# Patient Record
Sex: Male | Born: 1955 | Race: White | Hispanic: No | Marital: Married | Smoking: Former smoker
Health system: Southern US, Community
[De-identification: ages and names within clinical notes are randomized; demographics above are authoritative.]

## PROBLEM LIST (undated history)

## (undated) DIAGNOSIS — I872 Venous insufficiency (chronic) (peripheral): Secondary | ICD-10-CM

## (undated) DIAGNOSIS — E079 Disorder of thyroid, unspecified: Secondary | ICD-10-CM

## (undated) DIAGNOSIS — M1990 Unspecified osteoarthritis, unspecified site: Secondary | ICD-10-CM

## (undated) DIAGNOSIS — E039 Hypothyroidism, unspecified: Secondary | ICD-10-CM

## (undated) DIAGNOSIS — E119 Type 2 diabetes mellitus without complications: Secondary | ICD-10-CM

## (undated) HISTORY — PX: KNEE SURGERY: SHX244

## (undated) HISTORY — DX: Venous insufficiency (chronic) (peripheral): I87.2

## (undated) HISTORY — DX: Type 2 diabetes mellitus without complications: E11.9

## (undated) HISTORY — DX: Disorder of thyroid, unspecified: E07.9

---

## 1991-11-19 HISTORY — PX: COLONOSCOPY: SHX174

## 1998-11-18 DIAGNOSIS — E079 Disorder of thyroid, unspecified: Secondary | ICD-10-CM

## 1998-11-18 HISTORY — DX: Disorder of thyroid, unspecified: E07.9

## 2008-11-18 DIAGNOSIS — E119 Type 2 diabetes mellitus without complications: Secondary | ICD-10-CM

## 2008-11-18 HISTORY — DX: Type 2 diabetes mellitus without complications: E11.9

## 2010-04-30 ENCOUNTER — Ambulatory Visit: Payer: Self-pay | Admitting: Endocrinology

## 2010-12-26 IMAGING — CT CT STONE STUDY
1 of 2 series · 15 of 32 positions shown, 19 images · non-contrast
Comparison: none

REASON FOR EXAM: STAT CR [id number] hematuria passed kidney stones
COMMENTS:

PROCEDURE:     CT  - CT ABDOMEN /PELVIS WO (STONE)  - April 30, 2010 [DATE]
RESULT:
HISTORY: Hematuria.

[Series 2: stone · axial · 0.98mm/px · z∈[-633,-183]mm · 15 of 164 slices shown, 19 images]
[im 7/164  soft-tissue]
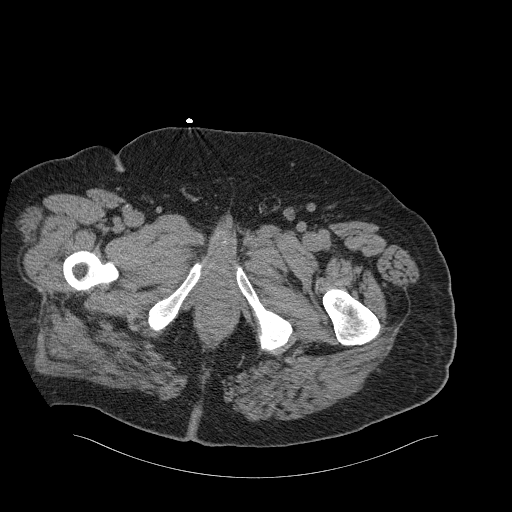
[im 7/164  bone]
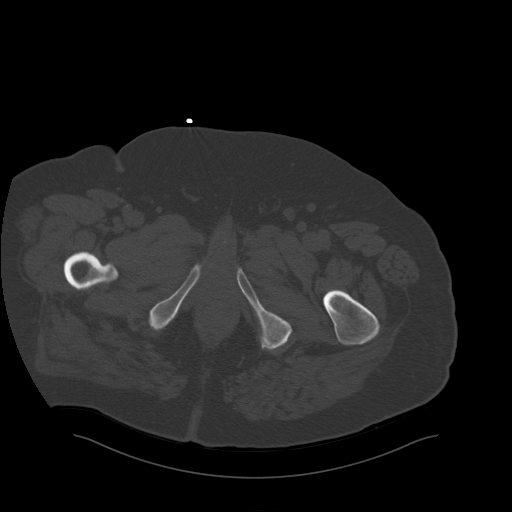
[im 19/164  soft-tissue]
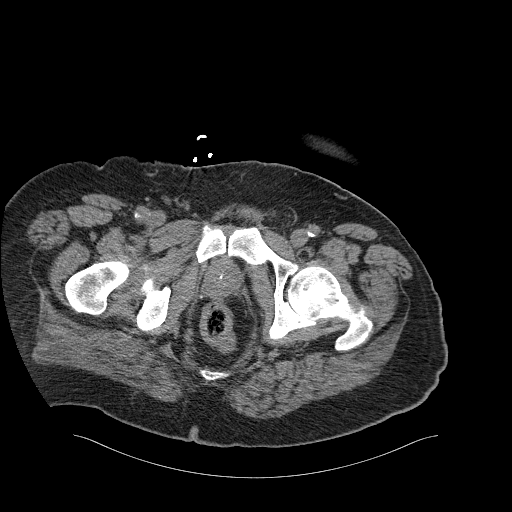
[im 32/164  soft-tissue]
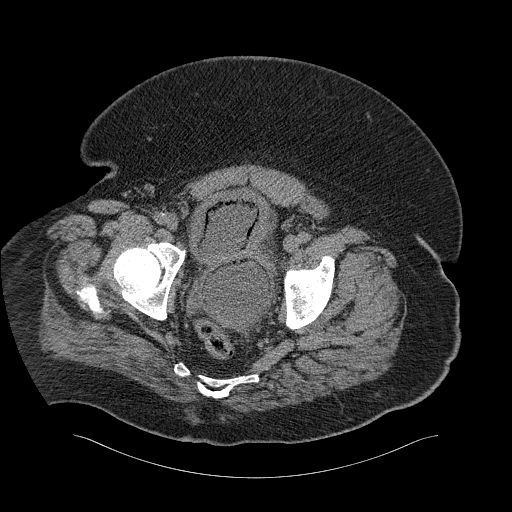
[im 44/164  soft-tissue]
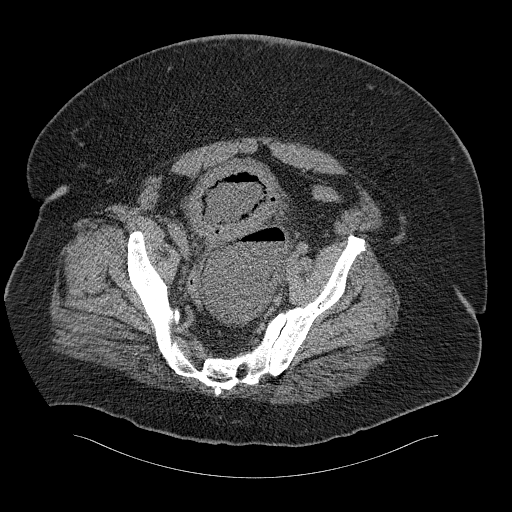
[im 57/164  soft-tissue]
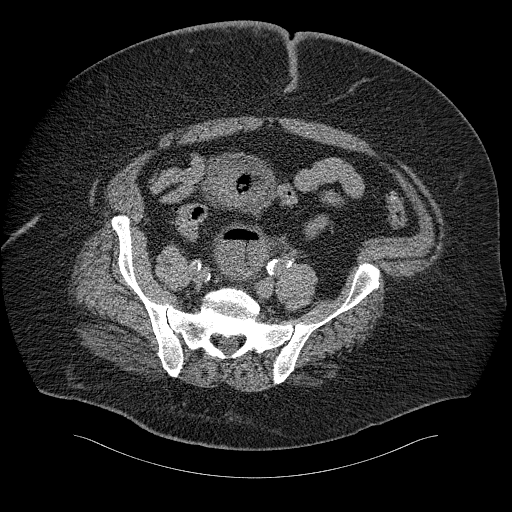
[im 69/164  soft-tissue]
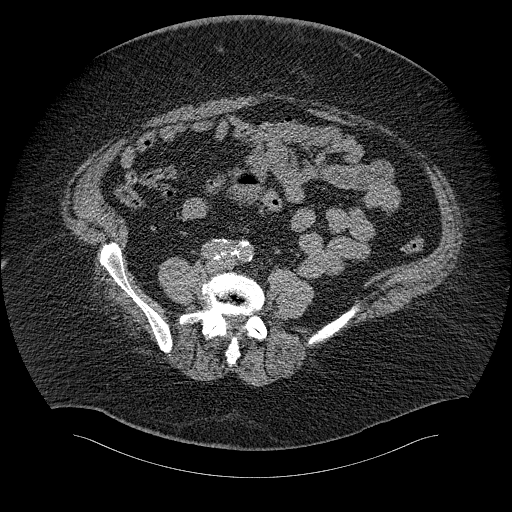
[im 82/164  soft-tissue]
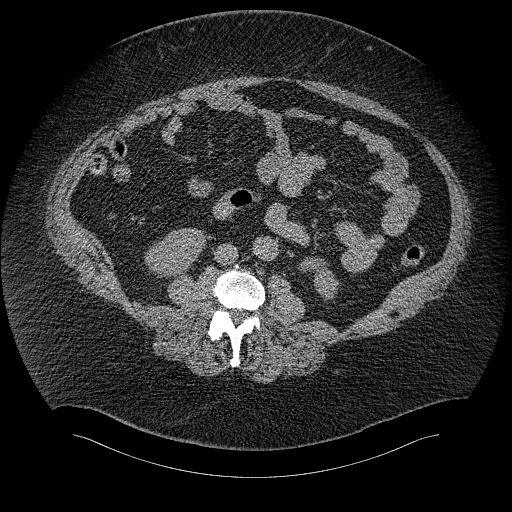
[im 95/164  soft-tissue]
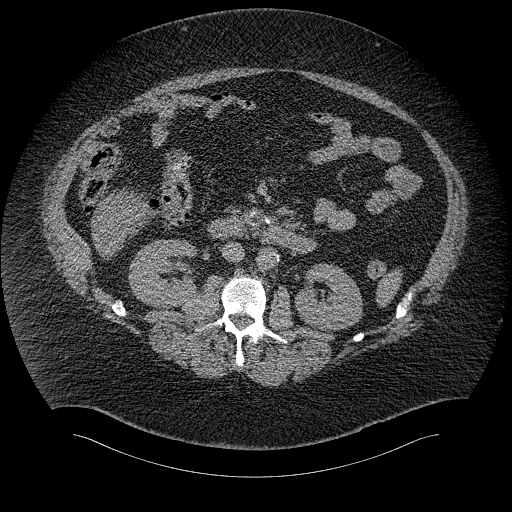
[im 107/164  soft-tissue]
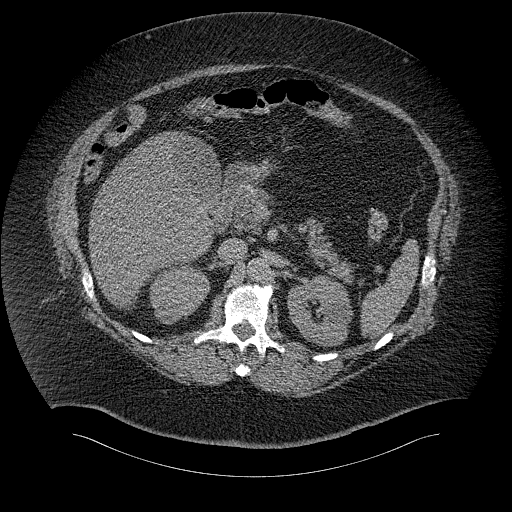
[im 107/164  bone]
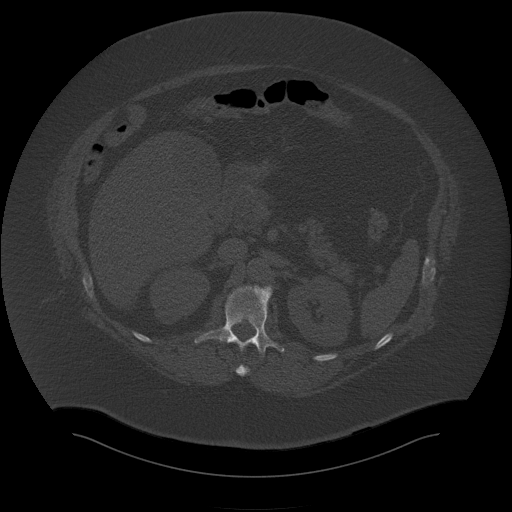
[im 120/164  soft-tissue]
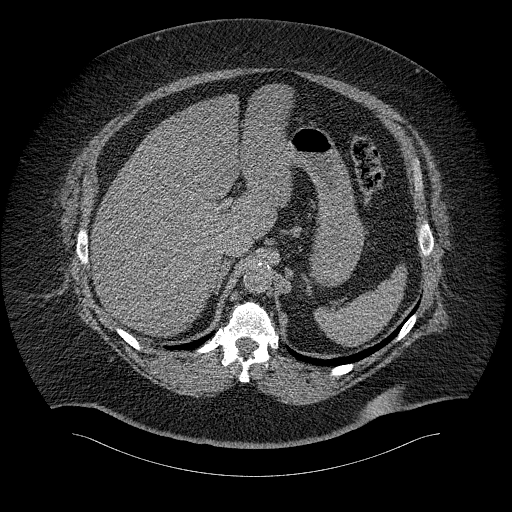
[im 132/164  soft-tissue]
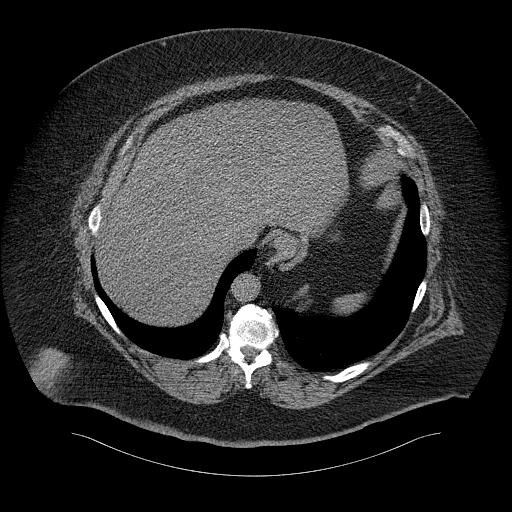
[im 138/164  lung]
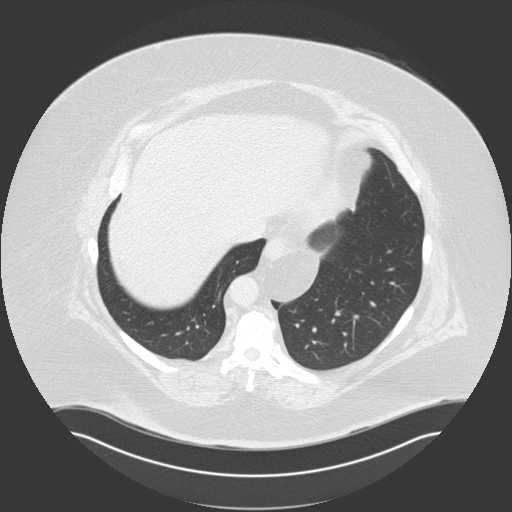
[im 145/164  soft-tissue]
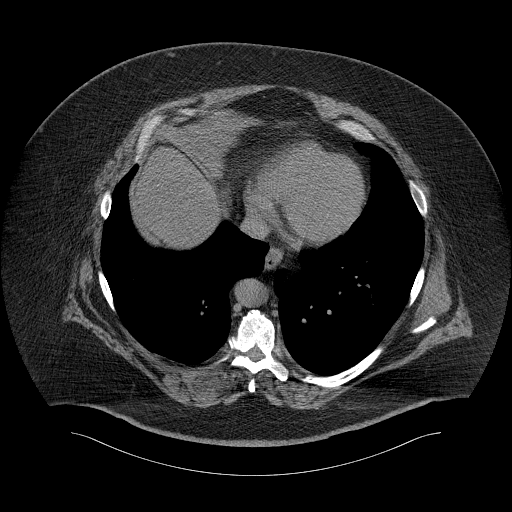
[im 145/164  lung]
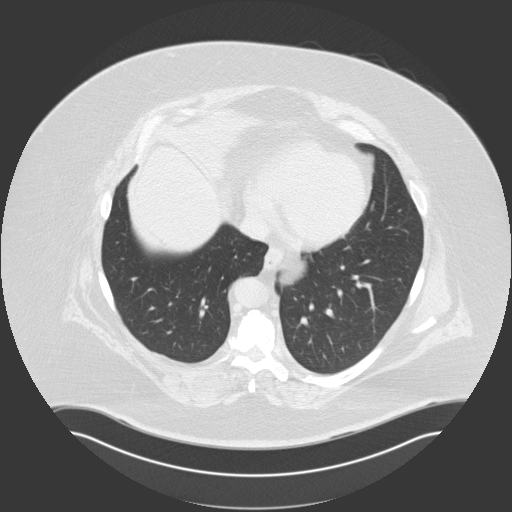
[im 151/164  lung]
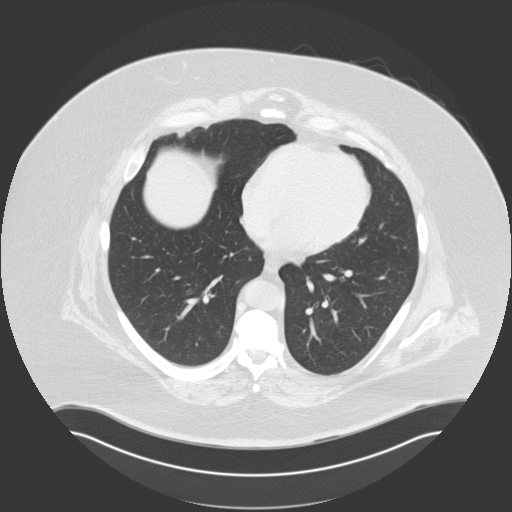
[im 157/164  soft-tissue]
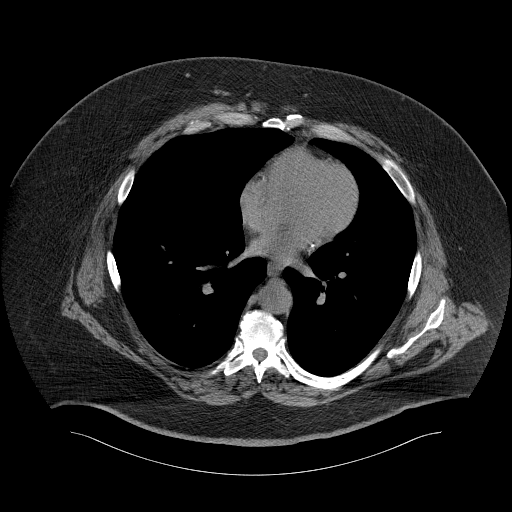
[im 157/164  lung]
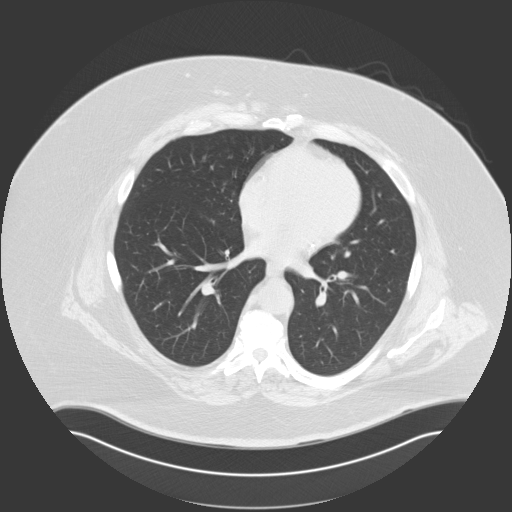

[15 of 32 positions shown; findings below may reference images not displayed]

COMPARISON STUDIES: No recent.

PROCEDURE AND FINDINGS: A simple cyst is present in the left kidney.
Aortoiliac atherosclerotic vascular disease is present. No hydronephrosis or
hydroureter is noted. Noted is thickening of the bladder wall with air in
the lumen of the bladder and the bladder wall. Posterior to the bladder is a
cystic structure which could represent a bladder diverticulum, a bowel
diverticulum or, or an abscess. There is air within the wall of this
structure as well as within the lumen. Urologic consultation is suggested.
Changes in the bladder could be related to severe chronic cystitis. Other
chronic inflammatory changes of the bladder could present in this fashion. A
bowel fistula may be present. Again, an abscess may be present.
IMPRESSION: Grossly abnormal pelvis with grossly abnormal bladder and
adjacent cystic structure, please see complete discussion. Urologic
consultation suggested.

## 2011-05-31 ENCOUNTER — Emergency Department: Payer: Self-pay | Admitting: *Deleted

## 2011-11-19 DIAGNOSIS — I872 Venous insufficiency (chronic) (peripheral): Secondary | ICD-10-CM

## 2011-11-19 HISTORY — DX: Venous insufficiency (chronic) (peripheral): I87.2

## 2012-01-26 IMAGING — CR RIGHT ELBOW - COMPLETE 3+ VIEW
1 series · 4 of 4 positions shown · non-contrast
Comparison: none

REASON FOR EXAM: fall, lac
COMMENTS:   May transport without cardiac monitor

[Series 1: view not recorded · 0.17mm/px · 4 of 4 slices shown]
[im 1/4]
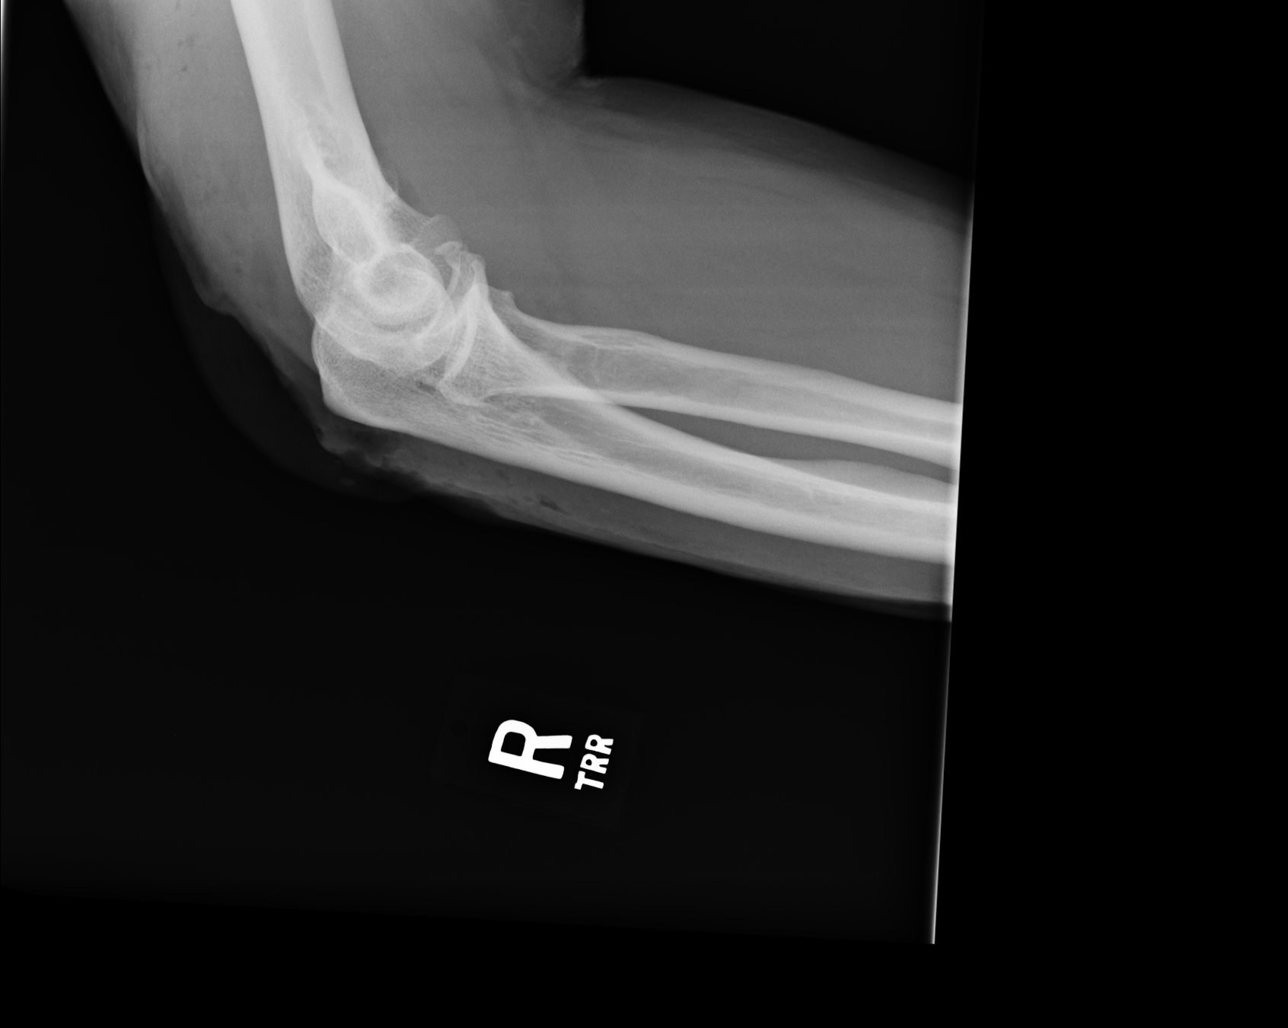
[im 2/4]
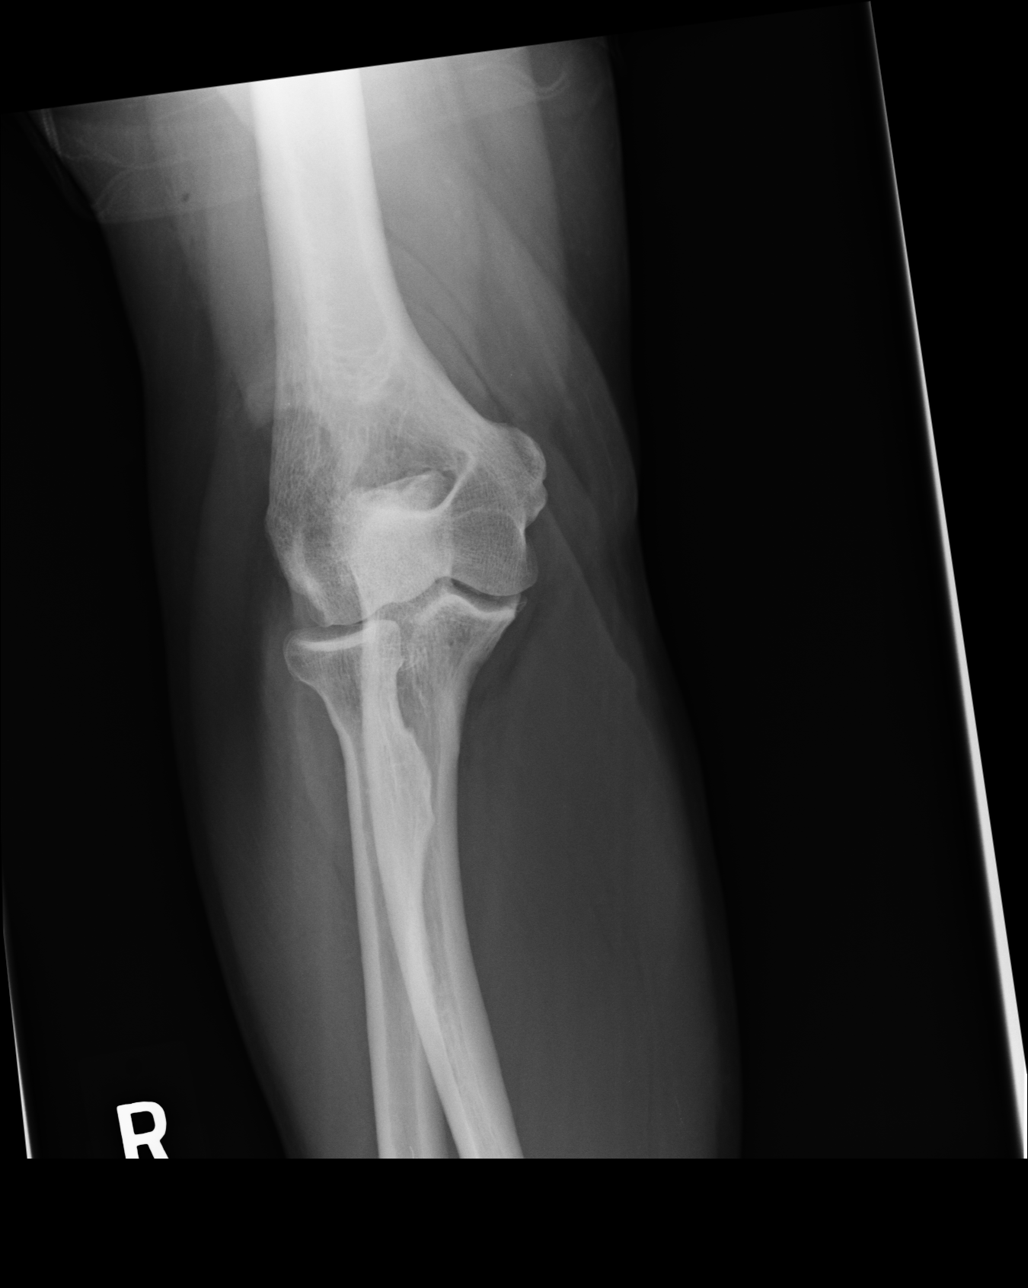
[im 3/4]
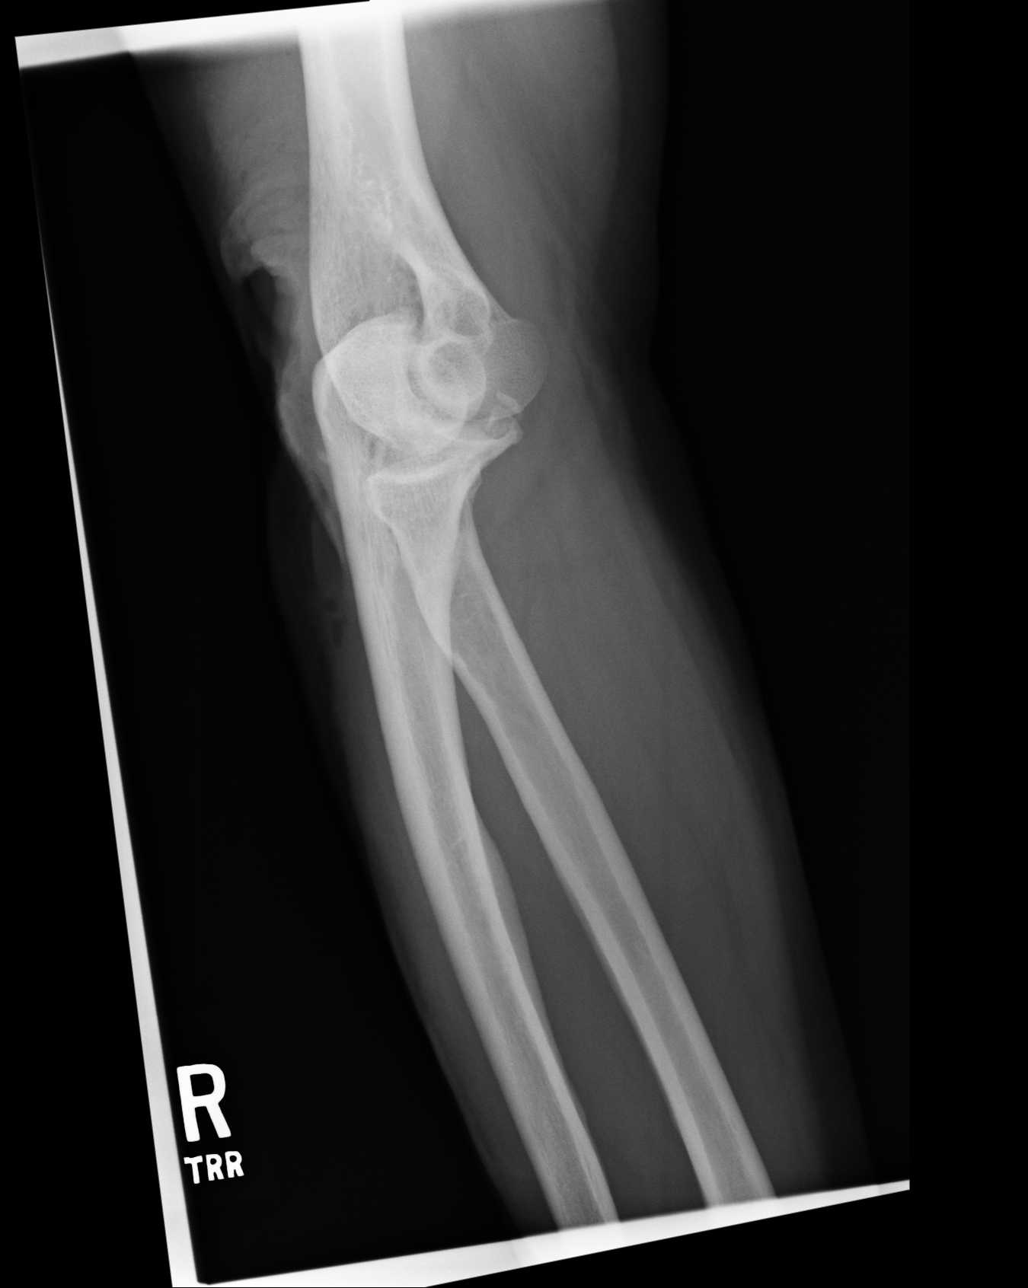
[im 4/4]
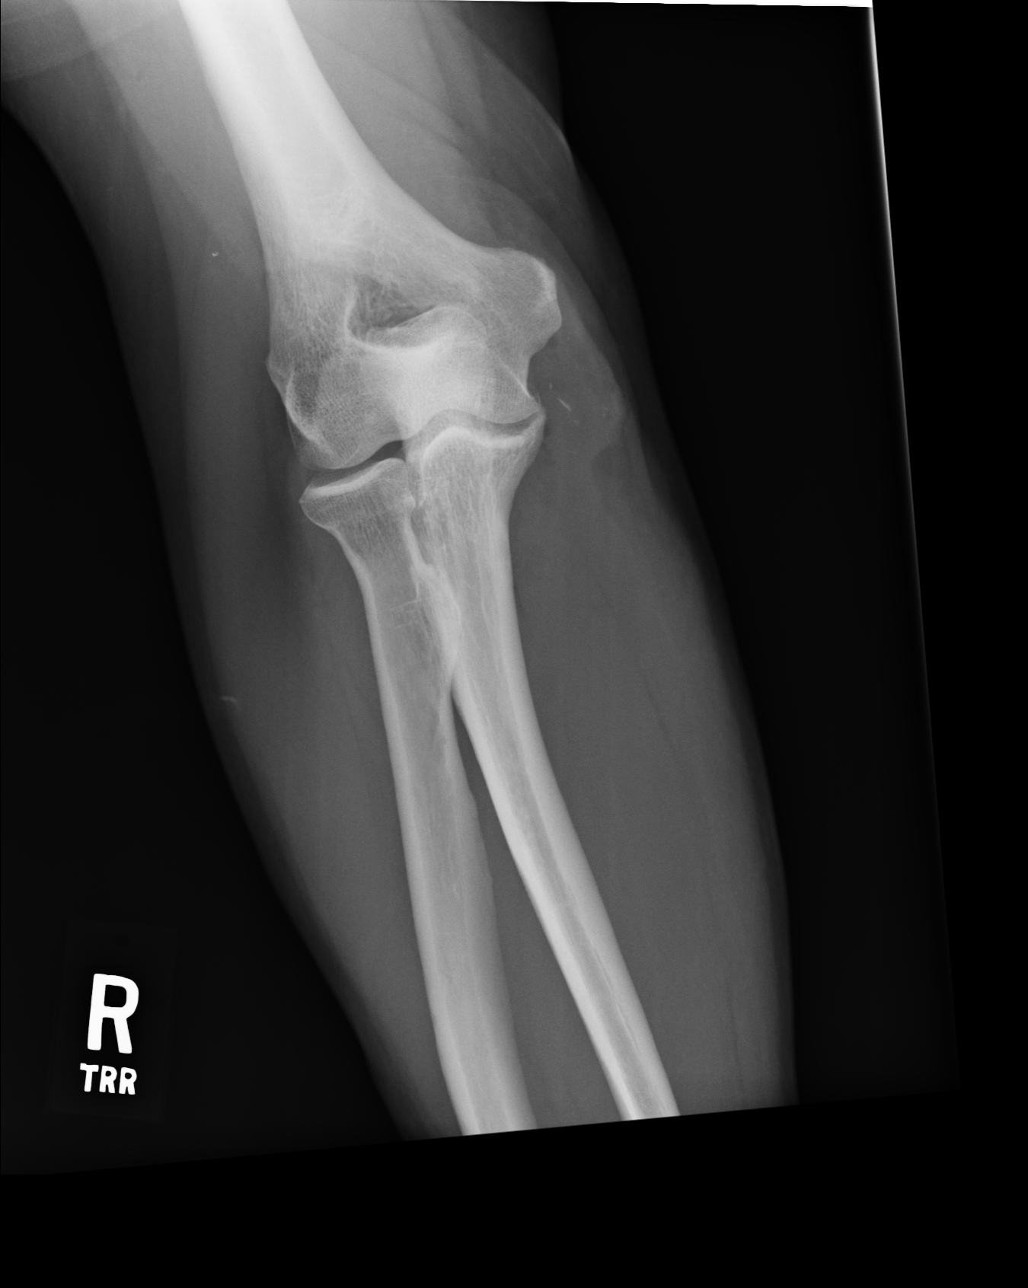

[4 of 4 positions shown; findings below may reference images not displayed]

PROCEDURE:     DXR - DXR ELBOW RT COMP W/OBLIQUES  - May 31, 2011  [DATE]

RESULT:     Four views of the elbow are submitted. The bones are adequately
mineralized for age. I do not see evidence of an acute fracture but there is
radiodense material that is seen adjacent to the coronoid process medially
on 2 views. This may reflect an avulsion. There is disruption of the soft
tissues over the extensor surface of the elbow.
IMPRESSION: 1. I cannot exclude avulsion from the coronoid process. There does appear to
be an osteophyte associated with the coronoid process.
2. There is no evidence of a radial head fracture, an olecranon fracture,
nor a supracondylar fracture.
3. There is disruption of the soft tissues over the extensor surface of the
elbow.

## 2013-01-08 ENCOUNTER — Encounter: Payer: Self-pay | Admitting: *Deleted

## 2013-03-03 ENCOUNTER — Encounter: Payer: Self-pay | Admitting: General Surgery

## 2013-03-03 ENCOUNTER — Ambulatory Visit (INDEPENDENT_AMBULATORY_CARE_PROVIDER_SITE_OTHER): Payer: PRIVATE HEALTH INSURANCE | Admitting: General Surgery

## 2013-03-03 VITALS — BP 140/80 | HR 84 | Resp 18 | Ht 70.0 in | Wt 343.0 lb

## 2013-03-03 DIAGNOSIS — I872 Venous insufficiency (chronic) (peripheral): Secondary | ICD-10-CM

## 2013-03-03 NOTE — Patient Instructions (Addendum)
Continue to wear compression hose Monitor for changes and call for questions or concerns

## 2013-03-03 NOTE — Progress Notes (Signed)
Patient ID: Randy Hart, male   DOB: 1956/03/11, 57 y.o.   MRN: [id number]  Chief Complaint  Patient presents with  . Follow-up    HPI Randy Hart is a 57 y.o. male.  Patient here today for follow up of lower extremity edema.  He has a history of right leg ulcer that was treated with unna boots in Sept 2013.  He is wearing his compression hose and states "getting along good", denies ulcers. HPI  Past Medical History  Diagnosis Date  . Diabetes mellitus without complication 2010  . Thyroid gland disease 2000  . Unspecified venous (peripheral) insufficiency 2013    Past Surgical History  Procedure Laterality Date  . Knee surgery Bilateral 1970,2001  . Colonoscopy  1993    [facility]     History reviewed. No pertinent family history.  Social History History  Substance Use Topics  . Smoking status: Former Smoker -- 5.00 packs/day for 5 years    Types: Cigarettes  . Smokeless tobacco: Not on file  . Alcohol Use: No    No Known Allergies  Current Outpatient Prescriptions  Medication Sig Dispense Refill  . Canagliflozin (INVOKANA) 300 MG TABS Take by mouth.      . finasteride (PROSCAR) 5 MG tablet Take 5 mg by mouth daily.      . furosemide (LASIX) 40 MG tablet Take 40 mg by mouth daily.      . insulin lispro protamine-insulin lispro (HUMALOG 50/50) (50-50) 100 UNIT/ML SUSP Inject into the skin 2 (two) times daily before a meal.      . lisinopril (PRINIVIL,ZESTRIL) 10 MG tablet Take 10 mg by mouth daily.      . Multiple Vitamins-Minerals (MULTIVITAMIN WITH MINERALS) tablet Take 1 tablet by mouth daily.      Marland Kitchen thyroid (ARMOUR) 180 MG tablet Take 180 mg by mouth daily.       No current facility-administered medications for this visit.    Review of Systems Review of Systems  Constitutional: Negative.   Respiratory: Negative.   Cardiovascular: Negative.     Blood pressure 140/80, pulse 84, resp. rate 18, height 5\' 10"  (1.778 m), weight 343 lb  (155.584 kg).  Physical Exam Physical Exam  Constitutional: He is oriented to person, place, and time. He appears well-developed and well-nourished.  Cardiovascular: Intact distal pulses.   Scant edema in upper portion of lower legs bilateral Healed ulceration anterior right lower 1/3 leg  Neurological: He is alert and oriented to person, place, and time.  Skin: Skin is warm and dry.    Data Reviewed None  Assessment    Chronic venous insufficiency with healed right leg ulcer    Plan    Patient advised to continue use of compression stockings       Telena Peyser G 03/07/2013, 11:34 AM

## 2013-03-07 ENCOUNTER — Encounter: Payer: Self-pay | Admitting: General Surgery

## 2013-03-07 DIAGNOSIS — I872 Venous insufficiency (chronic) (peripheral): Secondary | ICD-10-CM | POA: Insufficient documentation

## 2013-06-07 ENCOUNTER — Ambulatory Visit (INDEPENDENT_AMBULATORY_CARE_PROVIDER_SITE_OTHER): Payer: PRIVATE HEALTH INSURANCE | Admitting: General Surgery

## 2013-06-07 VITALS — BP 138/70 | HR 72 | Resp 16 | Ht 70.0 in | Wt 347.0 lb

## 2013-06-07 DIAGNOSIS — I872 Venous insufficiency (chronic) (peripheral): Secondary | ICD-10-CM

## 2013-06-07 NOTE — Patient Instructions (Addendum)
Patient to return in six months  

## 2013-06-07 NOTE — Progress Notes (Signed)
Patient ID: Randy Hart, male   DOB: Oct 24, 1956, 57 y.o.   MRN: [id number]  Chief Complaint  Patient presents with  . Other    leg check Venous insuff    HPI Randy Hart is a 57 y.o. male here today for an leg check . Pt has venous insifficiency. Initially treated with una boot, now using compression stockings. He his very comfortable with use of stockings-no leg symptoms. HPI  Past Medical History  Diagnosis Date  . Diabetes mellitus without complication 2010  . Thyroid gland disease 2000  . Unspecified venous (peripheral) insufficiency 2013    Past Surgical History  Procedure Laterality Date  . Knee surgery Bilateral 1970,2001  . Colonoscopy  1993    [facility]     History reviewed. No pertinent family history.  Social History History  Substance Use Topics  . Smoking status: Former Smoker -- 5.00 packs/day for 5 years    Types: Cigarettes  . Smokeless tobacco: Not on file  . Alcohol Use: No    No Known Allergies  Current Outpatient Prescriptions  Medication Sig Dispense Refill  . Canagliflozin (INVOKANA) 300 MG TABS Take by mouth.      . finasteride (PROSCAR) 5 MG tablet Take 5 mg by mouth daily.      . furosemide (LASIX) 40 MG tablet Take 40 mg by mouth daily.      . insulin lispro protamine-insulin lispro (HUMALOG 50/50) (50-50) 100 UNIT/ML SUSP Inject into the skin 2 (two) times daily before a meal.      . lisinopril (PRINIVIL,ZESTRIL) 10 MG tablet Take 10 mg by mouth daily.      . Multiple Vitamins-Minerals (MULTIVITAMIN WITH MINERALS) tablet Take 1 tablet by mouth daily.      Marland Kitchen thyroid (ARMOUR) 180 MG tablet Take 180 mg by mouth daily.       No current facility-administered medications for this visit.    Review of Systems Review of Systems  Constitutional: Negative.   Respiratory: Negative.   Cardiovascular: Negative.     Blood pressure 138/70, pulse 72, resp. rate 16, height 5\' 10"  (1.778 m), weight 347 lb (157.398  kg).  Physical Exam Physical ExamBoth legs with stasis changes noted-right more than left. Mild edema both legs.  Pedal pulses are strong.  Data Reviewed    Assessment    Venous insufficiency lower extremities. Stable     Plan    Continue with compression stockings        SANKAR,SEEPLAPUTHUR G 06/10/2013, 6:36 AM

## 2013-06-10 ENCOUNTER — Encounter: Payer: Self-pay | Admitting: General Surgery

## 2013-12-16 ENCOUNTER — Ambulatory Visit: Payer: PRIVATE HEALTH INSURANCE | Admitting: General Surgery

## 2014-02-10 ENCOUNTER — Ambulatory Visit: Payer: PRIVATE HEALTH INSURANCE | Admitting: General Surgery

## 2014-02-16 ENCOUNTER — Ambulatory Visit: Payer: PRIVATE HEALTH INSURANCE | Admitting: General Surgery

## 2014-03-15 ENCOUNTER — Ambulatory Visit: Payer: PRIVATE HEALTH INSURANCE | Admitting: General Surgery

## 2016-07-17 ENCOUNTER — Encounter: Payer: Self-pay | Admitting: *Deleted

## 2016-07-17 NOTE — Patient Instructions (Signed)
  Your procedure is scheduled on: 07-23-16 (TUESDAY) Report to Same Day Surgery 2nd floor medical mall To find out your arrival time please call [phone] between 1PM - 3PM on 07-19-16 (FRIDAY)  Remember: Instructions that are not followed completely may result in serious medical risk, up to and including death, or upon the discretion of your surgeon and anesthesiologist your surgery may need to be rescheduled.    _x___ 1. Do not eat food or drink liquids after midnight. No gum chewing or hard candies.     __x__ 2. No Alcohol for 24 hours before or after surgery.   __x__3. No Smoking for 24 prior to surgery.   ____  4. Bring all medications with you on the day of surgery if instructed.    __x__ 5. Notify your doctor if there is any change in your medical condition     (cold, fever, infections).     Do not wear jewelry, make-up, hairpins, clips or nail polish.  Do not wear lotions, powders, or perfumes. You may wear deodorant.  Do not shave 48 hours prior to surgery. Men may shave face and neck.  Do not bring valuables to the hospital.    [facility] is not responsible for any belongings or valuables.               Contacts, dentures or bridgework may not be worn into surgery.  Leave your suitcase in the car. After surgery it may be brought to your room.  For patients admitted to the hospital, discharge time is determined by your treatment team.   Patients discharged the day of surgery will not be allowed to drive home.    Please read over the following fact sheets that you were given:   [facility] and or MRSA Information   _x___ Take these medicines the morning of surgery with A SIP OF WATER:    1. LISINOPRIL  2. ARMOUR THYROID  3.  4.  5.  6.  ____ Fleet Enema (as directed)   ____ Use CHG Soap or sage wipes as directed on instruction sheet   ____ Use inhalers on the day of surgery and bring to hospital day of surgery  ____ Stop metformin 2 days  prior to surgery    _X___ Take 1/2 of usual insulin dose the night before surgery and none on the morning of surgery.   ____ Stop aspirin or coumadin, or plavix  _x__ Stop Anti-inflammatories such as Advil, Aleve, Ibuprofen, Motrin, Naproxen,          Naprosyn, Goodies powders or aspirin products. Ok to take Tylenol.   ____ Stop supplements until after surgery.    ____ Bring C-Pap to the hospital.

## 2016-07-19 ENCOUNTER — Encounter
Admission: RE | Admit: 2016-07-19 | Discharge: 2016-07-19 | Disposition: A | Discharge status: Home / Self Care | Payer: BLUE CROSS/BLUE SHIELD | Source: Ambulatory Visit | Attending: Urology | Admitting: Urology

## 2016-07-19 DIAGNOSIS — Z01812 Encounter for preprocedural laboratory examination: Secondary | ICD-10-CM | POA: Diagnosis not present

## 2016-07-19 LAB — BASIC METABOLIC PANEL
Anion gap: 4 — ABNORMAL LOW (ref 5–15)
BUN: 18 mg/dL (ref 6–20)
CALCIUM: 8.6 mg/dL — AB (ref 8.9–10.3)
CHLORIDE: 103 mmol/L (ref 101–111)
CO2: 28 mmol/L (ref 22–32)
CREATININE: 0.69 mg/dL (ref 0.61–1.24)
GFR calc non Af Amer: >60 mL/min (ref >60)
Glucose, Bld: 220 mg/dL — ABNORMAL HIGH (ref 65–99)
Potassium: 4 mmol/L (ref 3.5–5.1)
Sodium: 135 mmol/L (ref 135–145)

## 2016-07-23 ENCOUNTER — Encounter: Payer: Self-pay | Admitting: *Deleted

## 2016-07-23 ENCOUNTER — Ambulatory Visit
Admission: RE | Admit: 2016-07-23 | Discharge: 2016-07-23 | Disposition: A | Discharge status: Home / Self Care | Payer: BLUE CROSS/BLUE SHIELD | Source: Ambulatory Visit | Attending: Urology | Admitting: Urology

## 2016-07-23 ENCOUNTER — Ambulatory Visit: Payer: BLUE CROSS/BLUE SHIELD | Admitting: Anesthesiology

## 2016-07-23 ENCOUNTER — Encounter
Admission: RE | Disposition: A | Discharge status: Home / Self Care | Payer: Self-pay | Source: Ambulatory Visit | Attending: Urology

## 2016-07-23 DIAGNOSIS — Z6841 Body Mass Index (BMI) 40.0 and over, adult: Secondary | ICD-10-CM | POA: Diagnosis not present

## 2016-07-23 DIAGNOSIS — Z791 Long term (current) use of non-steroidal anti-inflammatories (NSAID): Secondary | ICD-10-CM | POA: Insufficient documentation

## 2016-07-23 DIAGNOSIS — E039 Hypothyroidism, unspecified: Secondary | ICD-10-CM | POA: Diagnosis not present

## 2016-07-23 DIAGNOSIS — Z9889 Other specified postprocedural states: Secondary | ICD-10-CM | POA: Diagnosis not present

## 2016-07-23 DIAGNOSIS — N323 Diverticulum of bladder: Secondary | ICD-10-CM | POA: Insufficient documentation

## 2016-07-23 DIAGNOSIS — R3915 Urgency of urination: Secondary | ICD-10-CM | POA: Diagnosis not present

## 2016-07-23 DIAGNOSIS — E669 Obesity, unspecified: Secondary | ICD-10-CM | POA: Insufficient documentation

## 2016-07-23 DIAGNOSIS — R350 Frequency of micturition: Secondary | ICD-10-CM | POA: Insufficient documentation

## 2016-07-23 DIAGNOSIS — Z794 Long term (current) use of insulin: Secondary | ICD-10-CM | POA: Insufficient documentation

## 2016-07-23 DIAGNOSIS — Z79899 Other long term (current) drug therapy: Secondary | ICD-10-CM | POA: Insufficient documentation

## 2016-07-23 DIAGNOSIS — E119 Type 2 diabetes mellitus without complications: Secondary | ICD-10-CM | POA: Diagnosis not present

## 2016-07-23 DIAGNOSIS — N138 Other obstructive and reflux uropathy: Secondary | ICD-10-CM | POA: Diagnosis not present

## 2016-07-23 DIAGNOSIS — N401 Enlarged prostate with lower urinary tract symptoms: Secondary | ICD-10-CM | POA: Insufficient documentation

## 2016-07-23 DIAGNOSIS — R3914 Feeling of incomplete bladder emptying: Secondary | ICD-10-CM | POA: Diagnosis not present

## 2016-07-23 DIAGNOSIS — R351 Nocturia: Secondary | ICD-10-CM | POA: Insufficient documentation

## 2016-07-23 DIAGNOSIS — N320 Bladder-neck obstruction: Secondary | ICD-10-CM | POA: Diagnosis not present

## 2016-07-23 HISTORY — PX: GREEN LIGHT LASER TURP (TRANSURETHRAL RESECTION OF PROSTATE: SHX6260

## 2016-07-23 HISTORY — DX: Unspecified osteoarthritis, unspecified site: M19.90

## 2016-07-23 HISTORY — DX: Hypothyroidism, unspecified: E03.9

## 2016-07-23 LAB — GLUCOSE, CAPILLARY
GLUCOSE-CAPILLARY: 154 mg/dL — AB (ref 65–99)
Glucose-Capillary: 207 mg/dL — ABNORMAL HIGH (ref 65–99)

## 2016-07-23 SURGERY — GREEN LIGHT LASER TURP (TRANSURETHRAL RESECTION OF PROSTATE
Anesthesia: General | Site: Prostate | Wound class: Clean Contaminated

## 2016-07-23 MED ORDER — URIBEL 118 MG PO CAPS: 1.0000 | ORAL_CAPSULE | Freq: Four times a day (QID) | ORAL | 3 refills | Status: DC | PRN

## 2016-07-23 MED ORDER — FENTANYL CITRATE (PF) 100 MCG/2ML IJ SOLN
INTRAMUSCULAR | Status: DC | PRN
  Administered 2016-07-23: 300 ug via INTRAVENOUS

## 2016-07-23 MED ORDER — GLYCOPYRROLATE 0.2 MG/ML IJ SOLN
INTRAMUSCULAR | Status: DC | PRN
  Administered 2016-07-23: 0.2 mg via INTRAVENOUS

## 2016-07-23 MED ORDER — PROPOFOL 10 MG/ML IV BOLUS
INTRAVENOUS | Status: DC | PRN
  Administered 2016-07-23: 200 mg via INTRAVENOUS

## 2016-07-23 MED ORDER — HYDROMORPHONE HCL 1 MG/ML IJ SOLN
INTRAMUSCULAR | Status: DC | PRN
  Administered 2016-07-23 (×2): 1 mg via INTRAVENOUS

## 2016-07-23 MED ORDER — EPHEDRINE SULFATE 50 MG/ML IJ SOLN
INTRAMUSCULAR | Status: DC | PRN
  Administered 2016-07-23: 15 mg via INTRAVENOUS

## 2016-07-23 MED ORDER — PHENYLEPHRINE HCL 10 MG/ML IJ SOLN
INTRAMUSCULAR | Status: DC | PRN
  Administered 2016-07-23: 200 ug via INTRAVENOUS
  Administered 2016-07-23: 100 ug via INTRAVENOUS
  Administered 2016-07-23: 200 ug via INTRAVENOUS

## 2016-07-23 MED ORDER — LEVOFLOXACIN 500 MG PO TABS: 500.0000 mg | ORAL_TABLET | Freq: Every day | ORAL | 0 refills | Status: AC

## 2016-07-23 MED ORDER — LIDOCAINE HCL (CARDIAC) 20 MG/ML IV SOLN
INTRAVENOUS | Status: DC | PRN
  Administered 2016-07-23 (×2): 100 mg via INTRAVENOUS

## 2016-07-23 MED ORDER — FENTANYL CITRATE (PF) 100 MCG/2ML IJ SOLN: 25.0000 ug | INTRAMUSCULAR | Status: DC | PRN

## 2016-07-23 MED ORDER — DOCUSATE SODIUM 100 MG PO CAPS: 200.0000 mg | ORAL_CAPSULE | Freq: Two times a day (BID) | ORAL | 3 refills | Status: AC

## 2016-07-23 MED ORDER — ONDANSETRON HCL 4 MG/2ML IJ SOLN: 4.0000 mg | Freq: Once | INTRAMUSCULAR | Status: DC | PRN

## 2016-07-23 MED ORDER — LIDOCAINE HCL 2 % EX GEL
CUTANEOUS | Status: AC
  Filled 2016-07-23: qty 10

## 2016-07-23 MED ORDER — ONDANSETRON HCL 4 MG/2ML IJ SOLN
INTRAMUSCULAR | Status: DC | PRN
  Administered 2016-07-23: 4 mg via INTRAVENOUS

## 2016-07-23 MED ORDER — SODIUM CHLORIDE 0.9 % IV SOLN
INTRAVENOUS | Status: DC
  Administered 2016-07-23 (×2): via INTRAVENOUS

## 2016-07-23 MED ORDER — FAMOTIDINE 20 MG PO TABS
ORAL_TABLET | ORAL | Status: AC
  Filled 2016-07-23: qty 1

## 2016-07-23 MED ORDER — ACETAMINOPHEN-CODEINE #3 300-30 MG PO TABS: 1.0000 | ORAL_TABLET | ORAL | 2 refills | Status: DC | PRN

## 2016-07-23 MED ORDER — FAMOTIDINE 20 MG PO TABS
20.0000 mg | ORAL_TABLET | Freq: Once | ORAL | Status: AC
  Administered 2016-07-23: 20 mg via ORAL

## 2016-07-23 MED ORDER — LIDOCAINE HCL 2 % EX GEL
CUTANEOUS | Status: DC | PRN
  Administered 2016-07-23: 1

## 2016-07-23 MED ORDER — KETAMINE HCL 50 MG/ML IJ SOLN
INTRAMUSCULAR | Status: DC | PRN
  Administered 2016-07-23: 50 mg via INTRAVENOUS

## 2016-07-23 MED ORDER — BELLADONNA ALKALOIDS-OPIUM 16.2-60 MG RE SUPP
RECTAL | Status: AC
  Filled 2016-07-23: qty 1

## 2016-07-23 MED ORDER — BELLADONNA ALKALOIDS-OPIUM 16.2-60 MG RE SUPP
RECTAL | Status: DC | PRN
  Administered 2016-07-23: 1 via RECTAL

## 2016-07-23 MED ORDER — LEVOFLOXACIN IN D5W 500 MG/100ML IV SOLN
500.0000 mg | Freq: Once | INTRAVENOUS | Status: AC
  Administered 2016-07-23: 500 mg via INTRAVENOUS

## 2016-07-23 MED ORDER — LEVOFLOXACIN IN D5W 500 MG/100ML IV SOLN
INTRAVENOUS | Status: AC
  Filled 2016-07-23: qty 100

## 2016-07-23 MED ORDER — ROCURONIUM BROMIDE 100 MG/10ML IV SOLN
INTRAVENOUS | Status: DC | PRN
  Administered 2016-07-23: 50 mg via INTRAVENOUS
  Administered 2016-07-23: 20 mg via INTRAVENOUS

## 2016-07-23 MED ORDER — MIDAZOLAM HCL 2 MG/2ML IJ SOLN
INTRAMUSCULAR | Status: DC | PRN
  Administered 2016-07-23: 2 mg via INTRAVENOUS

## 2016-07-23 MED ORDER — SUGAMMADEX SODIUM 200 MG/2ML IV SOLN
INTRAVENOUS | Status: DC | PRN
  Administered 2016-07-23: 200 mg via INTRAVENOUS

## 2016-07-23 MED ORDER — PHENYLEPHRINE HCL 10 MG/ML IJ SOLN
INTRAVENOUS | Status: DC | PRN
  Administered 2016-07-23: 30 ug/min via INTRAVENOUS

## 2016-07-23 MED ORDER — SUCCINYLCHOLINE CHLORIDE 20 MG/ML IJ SOLN
INTRAMUSCULAR | Status: DC | PRN
  Administered 2016-07-23: 120 mg via INTRAVENOUS

## 2016-07-23 SURGICAL SUPPLY — 30 items
ADAPTER IRRIG TUBE 2 SPIKE SOL (ADAPTER) ×6 IMPLANT
BAG URO DRAIN 2000ML W/SPOUT (MISCELLANEOUS) ×3 IMPLANT
CATH FOLEY 2WAY  5CC 20FR SIL (CATHETERS) ×2
CATH FOLEY 2WAY 5CC 20FR SIL (CATHETERS) ×1 IMPLANT
GLOVE BIO SURGEON STRL SZ7 (GLOVE) ×6 IMPLANT
GLOVE BIO SURGEON STRL SZ7.5 (GLOVE) ×6 IMPLANT
GOWN STRL REUS W/ TWL LRG LVL3 (GOWN DISPOSABLE) ×1 IMPLANT
GOWN STRL REUS W/ TWL XL LVL3 (GOWN DISPOSABLE) ×1 IMPLANT
GOWN STRL REUS W/TWL LRG LVL3 (GOWN DISPOSABLE) ×2
GOWN STRL REUS W/TWL XL LVL3 (GOWN DISPOSABLE) ×2
HOLDER FOLEY CATH W/STRAP (MISCELLANEOUS) ×3 IMPLANT
IV NS 1000ML (IV SOLUTION) ×2
IV NS 1000ML BAXH (IV SOLUTION) ×1 IMPLANT
IV SET PRIMARY 15D 139IN B9900 (IV SETS) ×3 IMPLANT
KIT RM TURNOVER CYSTO AR (KITS) ×3 IMPLANT
LASER GREENLIGHT XPS PROCEDURE (MISCELLANEOUS) ×3 IMPLANT
LASER GRNLGT 950 (MISCELLANEOUS) IMPLANT
LASER GRNLGT MOXY FIBER 750UM (MISCELLANEOUS) ×3 IMPLANT
PACK CYSTO AR (MISCELLANEOUS) ×3 IMPLANT
PREP PVP WINGED SPONGE (MISCELLANEOUS) IMPLANT
SET IRRIG Y TYPE TUR BLADDER L (SET/KITS/TRAYS/PACK) ×3 IMPLANT
SOL .9 NS 3000ML IRR  AL (IV SOLUTION) ×8
SOL .9 NS 3000ML IRR UROMATIC (IV SOLUTION) ×4 IMPLANT
SOL PREP PVP 2OZ (MISCELLANEOUS) ×3
SOLUTION PREP PVP 2OZ (MISCELLANEOUS) ×1 IMPLANT
SURGILUBE 2OZ TUBE FLIPTOP (MISCELLANEOUS) ×3 IMPLANT
SYRINGE IRR TOOMEY STRL 70CC (SYRINGE) ×3 IMPLANT
TUBING CONNECTING 10 (TUBING) IMPLANT
TUBING CONNECTING 10' (TUBING)
WATER STERILE IRR 1000ML POUR (IV SOLUTION) ×3 IMPLANT

## 2016-07-23 NOTE — H&P (Signed)
Date of Initial H&P: 07/16/16  History reviewed, patient examined, no change in status, stable for surgery. 

## 2016-07-23 NOTE — Anesthesia Preprocedure Evaluation (Signed)
Anesthesia Evaluation  Patient identified by MRN, date of birth, ID band Patient awake    Reviewed: Allergy & Precautions, H&P , NPO status , Patient's Chart, lab work & pertinent test results, reviewed documented beta blocker date and time   Airway Mallampati: II  TM Distance: >3 FB Neck ROM: full    Dental  (+) Teeth Intact   Pulmonary neg pulmonary ROS, former smoker,    Pulmonary exam normal        Cardiovascular Exercise Tolerance: Good + Peripheral Vascular Disease  negative cardio ROS Normal cardiovascular exam Rhythm:regular Rate:Normal     Neuro/Psych negative neurological ROS  negative psych ROS   GI/Hepatic negative GI ROS, Neg liver ROS,   Endo/Other  negative endocrine ROSdiabetesHypothyroidism Morbid obesity  Renal/GU negative Renal ROS  negative genitourinary   Musculoskeletal   Abdominal   Peds  Hematology negative hematology ROS (+)   Anesthesia Other Findings   Reproductive/Obstetrics negative OB ROS                             Anesthesia Physical Anesthesia Plan  ASA: III  Anesthesia Plan: General LMA   Post-op Pain Management:    Induction:   Airway Management Planned:   Additional Equipment:   Intra-op Plan:   Post-operative Plan:   Informed Consent: I have reviewed the patients History and Physical, chart, labs and discussed the procedure including the risks, benefits and alternatives for the proposed anesthesia with the patient or authorized representative who has indicated his/her understanding and acceptance.     Plan Discussed with: CRNA  Anesthesia Plan Comments:         Anesthesia Quick Evaluation

## 2016-07-23 NOTE — Anesthesia Procedure Notes (Signed)
Procedure Name: Intubation Date/Time: 07/23/2016 1:28 PM Performed by: Shirlee LimerickMARION, Lorrene Graef Pre-anesthesia Checklist: Patient identified, Emergency Drugs available, Suction available and Patient being monitored Patient Re-evaluated:Patient Re-evaluated prior to inductionOxygen Delivery Method: Circle system utilized Preoxygenation: Pre-oxygenation with 100% oxygen Intubation Type: IV induction Laryngoscope Size: Mac and 4 Grade View: Grade I Tube size: 7.0 mm Number of attempts: 1 Placement Confirmation: ETT inserted through vocal cords under direct vision,  positive ETCO2 and breath sounds checked- equal and bilateral Secured at: 23 cm Tube secured with: Tape Dental Injury: Teeth and Oropharynx as per pre-operative assessment

## 2016-07-23 NOTE — Discharge Instructions (Addendum)
Benign Prostatic Hyperplasia An enlarged prostate (benign prostatic hyperplasia) is common in older men. You may experience the following:  Weak urine stream.  Dribbling.  Feeling like the bladder has not emptied completely.  Difficulty starting urination.  Getting up frequently at night to urinate.  Urinating more frequently during the day. HOME CARE INSTRUCTIONS  Monitor your prostatic hyperplasia for any changes. The following actions may help to alleviate any discomfort you are experiencing:  Give yourself time when you urinate.  Stay away from alcohol.  Avoid beverages containing caffeine, such as coffee, tea, and colas, because they can make the problem worse.  Avoid decongestants, antihistamines, and some prescription medicines that can make the problem worse.  Follow up with your health care provider for further treatment as recommended. SEEK MEDICAL CARE IF:  You are experiencing progressive difficulty voiding.  Your urine stream is progressively getting narrower.  You are awaking from sleep with the urge to void more frequently.  You are constantly feeling the need to void.  You experience loss of urine, especially in small amounts. SEEK IMMEDIATE MEDICAL CARE IF:   You develop increased pain with urination or are unable to urinate.  You develop severe abdominal pain, vomiting, a high fever, or fainting.  You develop back pain or blood in your urine. MAKE SURE YOU:   Understand these instructions.  Will watch your condition.  Will get help right away if you are not doing well or get worse.   This information is not intended to replace advice given to you by your health care provider. Make sure you discuss any questions you have with your health care provider.   Document Released: 11/04/2005 Document Revised: 11/25/2014 Document Reviewed: 04/06/2013 Elsevier Interactive Patient Education 2016 [place] Light Laser Prostate Treatment Green  light laser therapy is a procedure that uses a special high-energy laser for vaporizing extra prostate tissue. It is less invasive than traditional methods of prostate surgery, which involve cutting out the prostate tissue. Because the tissue is vaporized rather than cut out there is generally less blood loss. LET [facility] CARE PROVIDER KNOW ABOUT:  Any allergies you have.  Any medicines you are taking, including vitamins, herbs, eye drops, creams, and over-the-counter medication.  Previous problems you or members of your family have had with the use of anesthetics.  Any blood disorders you have.  Previous surgeries you have had.  Medical conditions you have. RISKS AND COMPLICATIONS Generally, green light laser prostate treatment is a safe procedure. However, as with any procedure, complications can occur. Possible complications include:  Urinary tract infection.  Erectile dysfunction (rare).  Dry ejaculation--Semen is not released when you reach sexual climax.  Scar tissue in the urinary passage. BEFORE THE PROCEDURE   Your health care provider may discuss medicines you are taking and may advise you to stop taking specific ones.  You may be given antibiotic medicine to take as a precaution against bacterial infection.  Do not eat or drink anything for 8 hours before your procedure or as directed by your health care provider. You may have a sip of water to take any necessary medicines. PROCEDURE Depending on the size and shape of your prostate, the procedure may take 30-60 minutes. You will be given one of the following:   A medicine that makes you go to sleep (general anesthetic).  A medicine injected into your spine that numbs your body below the waist (spinal anesthetic). Sedation is usually given with spinal anesthetic so  you will be relaxed. A tube containing viewing scopes and instruments will be inserted through your penis so that no cuts (incisions) are needed. A thin  fiber is put through the tube and positioned next to the excess prostate tissue. Pulses of laser light come from the end of the fiber and are projected onto the excess tissue. The laser beam is absorbed by your blood, which becomes hot enough to vaporize the excess prostate tissue. This laser beam will seal off the blood vessels, decreasing bleeding. The tube with the viewing scopes, instruments, and thin fiber will be removed and replaced with a temporary catheter. AFTER THE PROCEDURE  After the surgery, you will be sent to the recovery room for a short time. Depending on factors such as the amount of prostate tissue vaporized, the strength of your bladder, and the amount of bleeding expected, the catheter may be removed. Generally, overnight stay is not needed and you will be sent home on the same day as the procedure. You may be sent home with elastic support stockings to help prevent blood clots in your legs.    This information is not intended to replace advice given to you by your health care provider. Make sure you discuss any questions you have with your health care provider.   Document Released: 02/11/2008 Document Revised: 11/09/2013 Document Reviewed: 04/26/2013 Elsevier Interactive Patient Education 2016 [facility] Prostate laser surgery is a procedure to eliminate prostate tissue. There are two types of prostate laser surgery: ablation (prostate tissue is melted away) and enucleation (prostate tissue is cut out). LET [facility] CARE PROVIDER KNOW ABOUT:  Any allergies you have.  All medicines you are taking, including vitamins, herbs, eye drops, creams, and over-the-counter medicines.  Previous problems you or members of your family have had with the use of anesthetics.  Any blood disorders you have.  Previous surgeries you have had.  Medical conditions you have. RISKS AND COMPLICATIONS  Generally prostate laser surgery is a safe procedure.  However, as with any procedure, problems can occur. Possible problems include:  Bleeding and the need for a blood transfusion.   Urinary tract infection.  Erectile dysfunction.  Narrowing (scar or stricture) of the urethra, which blocks the flow of urine.  Dry ejaculation (semen is not released when you reach sexual climax). BEFORE THE PROCEDURE   If you are on blood thinners, such as warfarin or clopidogrel, or nonprescription pain-relieving medicines, such as naproxen sodium or ibuprofen, you may be asked to stop taking them before the procedure.  Your health care provider may ask you to start taking antibiotic medicines before the procedure as a precaution against a bacterial infection. The procedure will not be performed if your urine is infected.  You should have nothing to eat or drink for at least 8 hours before your procedure, or as suggested by your health care provider. You may have a sip of water to take medications not stopped for the procedure. PROCEDURE  You will be given one of the following:   A medicine that numbs the area (local anesthetic).  A medicine injected into your spine that numbs your body below the waist (spinal anesthetic). A sedative is usually given with spinal anesthetic so you will be relaxed during the procedure. A viewing scope and instruments will be placed in a tube that is inserted through your penis, so no incisions will be needed to insert the scope and instruments. Depending on the type of laser  used, the prostate tissue will either be vaporized or cut away. The laser beam will coagulate any small bleeding areas. At the end of the surgery, a special tube will be inserted into your bladder to drain the urine from your bladder (urinary catheter). AFTER THE PROCEDURE You will be sent to the recovery room for a short time. In the recovery room, you will receive fluids through an IV tube inserted in one of your veins. Your blood pressure and pulse will  be checked frequently to make sure that they stabilize. Once you are eating and drinking fluids appropriately, the IV tube will be removed.  Depending on your specific needs, you may be admitted to the hospital or you will be sent home after the procedure. If you are sent home:  You may be sent home with elastic support stockings to help prevent blood clots in your legs.  You will also probably be given an antibiotic medicine.  Unless told otherwise, you may restart your other medications.  You may be given a stool softener.   This information is not intended to replace advice given to you by your health care provider. Make sure you discuss any questions you have with your health care provider.    AMBULATORY SURGERY  DISCHARGE INSTRUCTIONS   1) The drugs that you were given will stay in your system until tomorrow so for the next 24 hours you should not:  A) Drive an automobile B) Make any legal decisions C) Drink any alcoholic beverage   2) You may resume regular meals tomorrow.  Today it is better to start with liquids and gradually work up to solid foods.  You may eat anything you prefer, but it is better to start with liquids, then soup and crackers, and gradually work up to solid foods.   3) Please notify your doctor immediately if you have any unusual bleeding, trouble breathing, redness and pain at the surgery site, drainage, fever, or pain not relieved by medication.    4) Additional Instructions:        Please contact your physician with any problems or Same Day Surgery at [phone], Monday through Friday 6 am to 4 pm, or [place] [facility] Main number at [phone].   Document Released: 11/04/2005 Document Revised: 11/09/2013 Document Reviewed: 04/26/2013 Elsevier Interactive Patient Education [employer] Elsevier Inc[employer]

## 2016-07-23 NOTE — Transfer of Care (Signed)
Immediate Anesthesia Transfer of Care Note  Patient: Randy Hart  Procedure(s) Performed: Procedure(s): GREEN LIGHT LASER TURP (TRANSURETHRAL RESECTION OF PROSTATE (N/A)  Patient Location: PACU  Anesthesia Type:General  Level of Consciousness: awake, alert , oriented and patient cooperative  Airway & Oxygen Therapy: Patient connected to nasal cannula oxygen  Post-op Assessment: Report given to RN and Post -op Vital signs reviewed and stable  Post vital signs: Reviewed and stable  Last Vitals:  Vitals:   07/23/16 1205 07/23/16 1458  BP: (!) 155/80 124/73  Pulse: 91 93  Resp: (!) 24 12  Temp: 36.7 C 36.3 C    Last Pain:  Vitals:   07/23/16 1205  TempSrc: Oral         Complications: No apparent anesthesia complications

## 2016-07-23 NOTE — Anesthesia Postprocedure Evaluation (Signed)
Anesthesia Post Note  Patient: Randy Hart  Procedure(s) Performed: Procedure(s) (LRB): GREEN LIGHT LASER TURP (TRANSURETHRAL RESECTION OF PROSTATE (N/A)  Patient location during evaluation: PACU Anesthesia Type: General Level of consciousness: awake and alert Pain management: pain level controlled Vital Signs Assessment: post-procedure vital signs reviewed and stable Respiratory status: spontaneous breathing, nonlabored ventilation, respiratory function stable and patient connected to nasal cannula oxygen Cardiovascular status: blood pressure returned to baseline and stable Postop Assessment: no signs of nausea or vomiting Anesthetic complications: no    Last Vitals:  Vitals:   07/23/16 1558 07/23/16 1620  BP: 131/70 (!) 114/56  Pulse: 69 73  Resp: 17 16  Temp: 36.4 C     Last Pain:  Vitals:   07/23/16 1558  TempSrc: Tympanic                 Yevette EdwardsJames G Doroteo Nickolson

## 2016-07-23 NOTE — Op Note (Signed)
Preoperative diagnosis: 1. BPH with bladder outlet obstruction                                             2. Bladder diverticulum  Postoperative diagnosis: Same   Procedure: Photo vaporization of the prostate with the greenlight laser   Surgeon: Suszanne ConnersMichael R. Evelene CroonWolff MD  Anesthesia: General  Indications:See the history and physical. After informed consent the above procedure(s) were requested     Technique and findings: After adequate general anesthesia had been obtained the patient was placed into dorsal lithotomy position and the perineum was prepped and draped in the usual fashion. Meatus was dilated to 5028 [place] with the male dilators. The laserscope was coupled to the camera and then visually advanced into the bladder. Patient had trilobar BPH with a high-riding bladder neck and median lobe. There was a large posterior wall bladder diverticulum. The bladder was moderately trabeculated. No bladder tumors were identified. Both ureteral orifices were identified and had clear efflux. At this point the renal light XPS laser fiber was introduced through the scope and set at 80 W. Bladder neck and median lobe was vaporized. The power was increased to 120 W and remaining obstructive tissue from the bladder neck to the verumontanum was vaporized. The scope was then removed and 10 cc of viscous Xylocaine instilled within the urethra and bladder. A 20 French Foley catheter was placed and irrigated until clear. A B&O suppository was placed and the procedure was terminated. The patient was then transferred to the recovery room in stable condition.

## 2016-07-24 ENCOUNTER — Encounter: Payer: Self-pay | Admitting: Urology

## 2017-06-24 ENCOUNTER — Ambulatory Visit: Payer: BLUE CROSS/BLUE SHIELD | Admitting: Student in an Organized Health Care Education/Training Program

## 2019-03-24 DIAGNOSIS — N390 Urinary tract infection, site not specified: Secondary | ICD-10-CM | POA: Diagnosis not present

## 2019-03-24 DIAGNOSIS — N323 Diverticulum of bladder: Secondary | ICD-10-CM | POA: Diagnosis not present

## 2019-04-21 DIAGNOSIS — R351 Nocturia: Secondary | ICD-10-CM | POA: Diagnosis not present

## 2019-04-21 DIAGNOSIS — N401 Enlarged prostate with lower urinary tract symptoms: Secondary | ICD-10-CM | POA: Diagnosis not present

## 2019-04-21 DIAGNOSIS — R3129 Other microscopic hematuria: Secondary | ICD-10-CM | POA: Diagnosis not present

## 2019-04-21 DIAGNOSIS — R3915 Urgency of urination: Secondary | ICD-10-CM | POA: Diagnosis not present

## 2019-04-21 DIAGNOSIS — Z125 Encounter for screening for malignant neoplasm of prostate: Secondary | ICD-10-CM | POA: Diagnosis not present

## 2019-05-20 DIAGNOSIS — K760 Fatty (change of) liver, not elsewhere classified: Secondary | ICD-10-CM | POA: Diagnosis not present

## 2019-05-20 DIAGNOSIS — E790 Hyperuricemia without signs of inflammatory arthritis and tophaceous disease: Secondary | ICD-10-CM | POA: Diagnosis not present

## 2019-05-20 DIAGNOSIS — N400 Enlarged prostate without lower urinary tract symptoms: Secondary | ICD-10-CM | POA: Diagnosis not present

## 2019-05-20 DIAGNOSIS — N200 Calculus of kidney: Secondary | ICD-10-CM | POA: Diagnosis not present

## 2019-05-20 DIAGNOSIS — E1165 Type 2 diabetes mellitus with hyperglycemia: Secondary | ICD-10-CM | POA: Diagnosis not present

## 2019-05-20 DIAGNOSIS — E7801 Familial hypercholesterolemia: Secondary | ICD-10-CM | POA: Diagnosis not present

## 2019-05-20 DIAGNOSIS — E039 Hypothyroidism, unspecified: Secondary | ICD-10-CM | POA: Diagnosis not present

## 2019-05-28 DIAGNOSIS — K760 Fatty (change of) liver, not elsewhere classified: Secondary | ICD-10-CM | POA: Diagnosis not present

## 2019-05-28 DIAGNOSIS — Z6841 Body Mass Index (BMI) 40.0 and over, adult: Secondary | ICD-10-CM | POA: Diagnosis not present

## 2019-05-28 DIAGNOSIS — N3001 Acute cystitis with hematuria: Secondary | ICD-10-CM | POA: Diagnosis not present

## 2019-05-28 DIAGNOSIS — E668 Other obesity: Secondary | ICD-10-CM | POA: Diagnosis not present

## 2019-05-28 DIAGNOSIS — E049 Nontoxic goiter, unspecified: Secondary | ICD-10-CM | POA: Diagnosis not present

## 2019-05-28 DIAGNOSIS — E7801 Familial hypercholesterolemia: Secondary | ICD-10-CM | POA: Diagnosis not present

## 2019-05-28 DIAGNOSIS — N400 Enlarged prostate without lower urinary tract symptoms: Secondary | ICD-10-CM | POA: Diagnosis not present

## 2019-05-28 DIAGNOSIS — N200 Calculus of kidney: Secondary | ICD-10-CM | POA: Diagnosis not present

## 2019-05-28 DIAGNOSIS — M5440 Lumbago with sciatica, unspecified side: Secondary | ICD-10-CM | POA: Diagnosis not present

## 2019-05-28 DIAGNOSIS — E1165 Type 2 diabetes mellitus with hyperglycemia: Secondary | ICD-10-CM | POA: Diagnosis not present

## 2019-05-28 DIAGNOSIS — M2390 Unspecified internal derangement of unspecified knee: Secondary | ICD-10-CM | POA: Diagnosis not present

## 2019-05-28 DIAGNOSIS — R600 Localized edema: Secondary | ICD-10-CM | POA: Diagnosis not present

## 2019-05-28 DIAGNOSIS — I10 Essential (primary) hypertension: Secondary | ICD-10-CM | POA: Diagnosis not present

## 2019-06-21 DIAGNOSIS — Z23 Encounter for immunization: Secondary | ICD-10-CM | POA: Diagnosis not present

## 2019-06-21 DIAGNOSIS — Z1211 Encounter for screening for malignant neoplasm of colon: Secondary | ICD-10-CM | POA: Diagnosis not present

## 2019-06-21 DIAGNOSIS — Z136 Encounter for screening for cardiovascular disorders: Secondary | ICD-10-CM | POA: Diagnosis not present

## 2019-06-21 DIAGNOSIS — Z0000 Encounter for general adult medical examination without abnormal findings: Secondary | ICD-10-CM | POA: Diagnosis not present

## 2019-06-21 DIAGNOSIS — Z125 Encounter for screening for malignant neoplasm of prostate: Secondary | ICD-10-CM | POA: Diagnosis not present

## 2019-06-21 DIAGNOSIS — Z7183 Encounter for nonprocreative genetic counseling: Secondary | ICD-10-CM | POA: Diagnosis not present

## 2019-06-21 DIAGNOSIS — Z713 Dietary counseling and surveillance: Secondary | ICD-10-CM | POA: Diagnosis not present

## 2019-08-05 DIAGNOSIS — R0989 Other specified symptoms and signs involving the circulatory and respiratory systems: Secondary | ICD-10-CM | POA: Diagnosis not present

## 2019-08-05 DIAGNOSIS — R9431 Abnormal electrocardiogram [ECG] [EKG]: Secondary | ICD-10-CM | POA: Diagnosis not present

## 2019-08-05 DIAGNOSIS — I493 Ventricular premature depolarization: Secondary | ICD-10-CM | POA: Diagnosis not present

## 2019-08-05 DIAGNOSIS — R600 Localized edema: Secondary | ICD-10-CM | POA: Diagnosis not present

## 2019-08-05 DIAGNOSIS — I10 Essential (primary) hypertension: Secondary | ICD-10-CM | POA: Diagnosis not present

## 2019-09-15 DIAGNOSIS — K760 Fatty (change of) liver, not elsewhere classified: Secondary | ICD-10-CM | POA: Diagnosis not present

## 2019-09-15 DIAGNOSIS — E063 Autoimmune thyroiditis: Secondary | ICD-10-CM | POA: Diagnosis not present

## 2019-09-15 DIAGNOSIS — E039 Hypothyroidism, unspecified: Secondary | ICD-10-CM | POA: Diagnosis not present

## 2019-09-15 DIAGNOSIS — E1165 Type 2 diabetes mellitus with hyperglycemia: Secondary | ICD-10-CM | POA: Diagnosis not present

## 2019-09-15 DIAGNOSIS — E049 Nontoxic goiter, unspecified: Secondary | ICD-10-CM | POA: Diagnosis not present

## 2019-09-15 DIAGNOSIS — N200 Calculus of kidney: Secondary | ICD-10-CM | POA: Diagnosis not present

## 2019-09-15 DIAGNOSIS — N400 Enlarged prostate without lower urinary tract symptoms: Secondary | ICD-10-CM | POA: Diagnosis not present

## 2019-09-15 DIAGNOSIS — E7801 Familial hypercholesterolemia: Secondary | ICD-10-CM | POA: Diagnosis not present

## 2019-09-15 DIAGNOSIS — E668 Other obesity: Secondary | ICD-10-CM | POA: Diagnosis not present

## 2019-09-15 DIAGNOSIS — M2390 Unspecified internal derangement of unspecified knee: Secondary | ICD-10-CM | POA: Diagnosis not present

## 2019-09-15 DIAGNOSIS — I10 Essential (primary) hypertension: Secondary | ICD-10-CM | POA: Diagnosis not present

## 2019-09-15 DIAGNOSIS — M5440 Lumbago with sciatica, unspecified side: Secondary | ICD-10-CM | POA: Diagnosis not present

## 2019-09-15 DIAGNOSIS — R600 Localized edema: Secondary | ICD-10-CM | POA: Diagnosis not present

## 2019-09-16 DIAGNOSIS — N400 Enlarged prostate without lower urinary tract symptoms: Secondary | ICD-10-CM | POA: Diagnosis not present

## 2019-09-16 DIAGNOSIS — L03115 Cellulitis of right lower limb: Secondary | ICD-10-CM | POA: Diagnosis not present

## 2019-09-16 DIAGNOSIS — M5440 Lumbago with sciatica, unspecified side: Secondary | ICD-10-CM | POA: Diagnosis not present

## 2019-09-16 DIAGNOSIS — M2390 Unspecified internal derangement of unspecified knee: Secondary | ICD-10-CM | POA: Diagnosis not present

## 2019-09-16 DIAGNOSIS — R600 Localized edema: Secondary | ICD-10-CM | POA: Diagnosis not present

## 2019-09-16 DIAGNOSIS — E7801 Familial hypercholesterolemia: Secondary | ICD-10-CM | POA: Diagnosis not present

## 2019-09-16 DIAGNOSIS — I10 Essential (primary) hypertension: Secondary | ICD-10-CM | POA: Diagnosis not present

## 2019-09-16 DIAGNOSIS — K760 Fatty (change of) liver, not elsewhere classified: Secondary | ICD-10-CM | POA: Diagnosis not present

## 2019-09-16 DIAGNOSIS — E1165 Type 2 diabetes mellitus with hyperglycemia: Secondary | ICD-10-CM | POA: Diagnosis not present

## 2019-09-17 DIAGNOSIS — E1165 Type 2 diabetes mellitus with hyperglycemia: Secondary | ICD-10-CM | POA: Diagnosis not present

## 2019-09-17 DIAGNOSIS — N400 Enlarged prostate without lower urinary tract symptoms: Secondary | ICD-10-CM | POA: Diagnosis not present

## 2019-09-17 DIAGNOSIS — M2390 Unspecified internal derangement of unspecified knee: Secondary | ICD-10-CM | POA: Diagnosis not present

## 2019-09-17 DIAGNOSIS — M5440 Lumbago with sciatica, unspecified side: Secondary | ICD-10-CM | POA: Diagnosis not present

## 2019-09-17 DIAGNOSIS — R600 Localized edema: Secondary | ICD-10-CM | POA: Diagnosis not present

## 2019-09-17 DIAGNOSIS — E7801 Familial hypercholesterolemia: Secondary | ICD-10-CM | POA: Diagnosis not present

## 2019-09-17 DIAGNOSIS — L03115 Cellulitis of right lower limb: Secondary | ICD-10-CM | POA: Diagnosis not present

## 2019-09-17 DIAGNOSIS — K760 Fatty (change of) liver, not elsewhere classified: Secondary | ICD-10-CM | POA: Diagnosis not present

## 2019-09-20 DIAGNOSIS — E790 Hyperuricemia without signs of inflammatory arthritis and tophaceous disease: Secondary | ICD-10-CM | POA: Diagnosis not present

## 2019-09-20 DIAGNOSIS — R600 Localized edema: Secondary | ICD-10-CM | POA: Diagnosis not present

## 2019-09-20 DIAGNOSIS — K760 Fatty (change of) liver, not elsewhere classified: Secondary | ICD-10-CM | POA: Diagnosis not present

## 2019-09-20 DIAGNOSIS — E039 Hypothyroidism, unspecified: Secondary | ICD-10-CM | POA: Diagnosis not present

## 2019-09-20 DIAGNOSIS — D509 Iron deficiency anemia, unspecified: Secondary | ICD-10-CM | POA: Diagnosis not present

## 2019-09-20 DIAGNOSIS — N400 Enlarged prostate without lower urinary tract symptoms: Secondary | ICD-10-CM | POA: Diagnosis not present

## 2019-09-20 DIAGNOSIS — E1165 Type 2 diabetes mellitus with hyperglycemia: Secondary | ICD-10-CM | POA: Diagnosis not present

## 2019-09-20 DIAGNOSIS — E7801 Familial hypercholesterolemia: Secondary | ICD-10-CM | POA: Diagnosis not present

## 2019-09-27 DIAGNOSIS — K760 Fatty (change of) liver, not elsewhere classified: Secondary | ICD-10-CM | POA: Diagnosis not present

## 2019-09-27 DIAGNOSIS — N400 Enlarged prostate without lower urinary tract symptoms: Secondary | ICD-10-CM | POA: Diagnosis not present

## 2019-09-27 DIAGNOSIS — Z6841 Body Mass Index (BMI) 40.0 and over, adult: Secondary | ICD-10-CM | POA: Diagnosis not present

## 2019-09-27 DIAGNOSIS — I10 Essential (primary) hypertension: Secondary | ICD-10-CM | POA: Diagnosis not present

## 2019-09-27 DIAGNOSIS — E049 Nontoxic goiter, unspecified: Secondary | ICD-10-CM | POA: Diagnosis not present

## 2019-09-27 DIAGNOSIS — M5440 Lumbago with sciatica, unspecified side: Secondary | ICD-10-CM | POA: Diagnosis not present

## 2019-09-27 DIAGNOSIS — E1165 Type 2 diabetes mellitus with hyperglycemia: Secondary | ICD-10-CM | POA: Diagnosis not present

## 2019-09-27 DIAGNOSIS — E7801 Familial hypercholesterolemia: Secondary | ICD-10-CM | POA: Diagnosis not present

## 2019-09-27 DIAGNOSIS — R600 Localized edema: Secondary | ICD-10-CM | POA: Diagnosis not present

## 2019-09-27 DIAGNOSIS — E668 Other obesity: Secondary | ICD-10-CM | POA: Diagnosis not present

## 2019-09-27 DIAGNOSIS — L03115 Cellulitis of right lower limb: Secondary | ICD-10-CM | POA: Diagnosis not present

## 2019-10-26 DIAGNOSIS — E790 Hyperuricemia without signs of inflammatory arthritis and tophaceous disease: Secondary | ICD-10-CM | POA: Diagnosis not present

## 2019-10-26 DIAGNOSIS — E1165 Type 2 diabetes mellitus with hyperglycemia: Secondary | ICD-10-CM | POA: Diagnosis not present

## 2019-10-26 DIAGNOSIS — E7801 Familial hypercholesterolemia: Secondary | ICD-10-CM | POA: Diagnosis not present

## 2019-10-26 DIAGNOSIS — D509 Iron deficiency anemia, unspecified: Secondary | ICD-10-CM | POA: Diagnosis not present

## 2019-10-26 DIAGNOSIS — R600 Localized edema: Secondary | ICD-10-CM | POA: Diagnosis not present

## 2019-10-26 DIAGNOSIS — L03115 Cellulitis of right lower limb: Secondary | ICD-10-CM | POA: Diagnosis not present

## 2019-10-26 DIAGNOSIS — I10 Essential (primary) hypertension: Secondary | ICD-10-CM | POA: Diagnosis not present

## 2019-10-26 DIAGNOSIS — K760 Fatty (change of) liver, not elsewhere classified: Secondary | ICD-10-CM | POA: Diagnosis not present

## 2019-11-03 DIAGNOSIS — Z6841 Body Mass Index (BMI) 40.0 and over, adult: Secondary | ICD-10-CM | POA: Diagnosis not present

## 2019-11-03 DIAGNOSIS — E039 Hypothyroidism, unspecified: Secondary | ICD-10-CM | POA: Diagnosis not present

## 2019-11-03 DIAGNOSIS — E049 Nontoxic goiter, unspecified: Secondary | ICD-10-CM | POA: Diagnosis not present

## 2019-11-03 DIAGNOSIS — E7801 Familial hypercholesterolemia: Secondary | ICD-10-CM | POA: Diagnosis not present

## 2019-11-03 DIAGNOSIS — R600 Localized edema: Secondary | ICD-10-CM | POA: Diagnosis not present

## 2019-11-03 DIAGNOSIS — E1165 Type 2 diabetes mellitus with hyperglycemia: Secondary | ICD-10-CM | POA: Diagnosis not present

## 2019-11-03 DIAGNOSIS — M2390 Unspecified internal derangement of unspecified knee: Secondary | ICD-10-CM | POA: Diagnosis not present

## 2019-11-03 DIAGNOSIS — I10 Essential (primary) hypertension: Secondary | ICD-10-CM | POA: Diagnosis not present

## 2019-11-03 DIAGNOSIS — E668 Other obesity: Secondary | ICD-10-CM | POA: Diagnosis not present

## 2019-11-03 DIAGNOSIS — M5440 Lumbago with sciatica, unspecified side: Secondary | ICD-10-CM | POA: Diagnosis not present

## 2019-11-03 DIAGNOSIS — K760 Fatty (change of) liver, not elsewhere classified: Secondary | ICD-10-CM | POA: Diagnosis not present

## 2019-11-03 DIAGNOSIS — N400 Enlarged prostate without lower urinary tract symptoms: Secondary | ICD-10-CM | POA: Diagnosis not present

## 2020-01-25 DIAGNOSIS — E1165 Type 2 diabetes mellitus with hyperglycemia: Secondary | ICD-10-CM | POA: Diagnosis not present

## 2020-01-25 DIAGNOSIS — E7801 Familial hypercholesterolemia: Secondary | ICD-10-CM | POA: Diagnosis not present

## 2020-01-25 DIAGNOSIS — K219 Gastro-esophageal reflux disease without esophagitis: Secondary | ICD-10-CM | POA: Diagnosis not present

## 2020-01-25 DIAGNOSIS — E668 Other obesity: Secondary | ICD-10-CM | POA: Diagnosis not present

## 2020-01-25 DIAGNOSIS — K760 Fatty (change of) liver, not elsewhere classified: Secondary | ICD-10-CM | POA: Diagnosis not present

## 2020-01-25 DIAGNOSIS — E039 Hypothyroidism, unspecified: Secondary | ICD-10-CM | POA: Diagnosis not present

## 2020-01-31 DIAGNOSIS — E7801 Familial hypercholesterolemia: Secondary | ICD-10-CM | POA: Diagnosis not present

## 2020-01-31 DIAGNOSIS — K760 Fatty (change of) liver, not elsewhere classified: Secondary | ICD-10-CM | POA: Diagnosis not present

## 2020-01-31 DIAGNOSIS — E1165 Type 2 diabetes mellitus with hyperglycemia: Secondary | ICD-10-CM | POA: Diagnosis not present

## 2020-01-31 DIAGNOSIS — E049 Nontoxic goiter, unspecified: Secondary | ICD-10-CM | POA: Diagnosis not present

## 2020-01-31 DIAGNOSIS — Z6841 Body Mass Index (BMI) 40.0 and over, adult: Secondary | ICD-10-CM | POA: Diagnosis not present

## 2020-01-31 DIAGNOSIS — N400 Enlarged prostate without lower urinary tract symptoms: Secondary | ICD-10-CM | POA: Diagnosis not present

## 2020-01-31 DIAGNOSIS — I10 Essential (primary) hypertension: Secondary | ICD-10-CM | POA: Diagnosis not present

## 2020-01-31 DIAGNOSIS — J019 Acute sinusitis, unspecified: Secondary | ICD-10-CM | POA: Diagnosis not present

## 2020-01-31 DIAGNOSIS — M2390 Unspecified internal derangement of unspecified knee: Secondary | ICD-10-CM | POA: Diagnosis not present

## 2020-01-31 DIAGNOSIS — E668 Other obesity: Secondary | ICD-10-CM | POA: Diagnosis not present

## 2020-01-31 DIAGNOSIS — R600 Localized edema: Secondary | ICD-10-CM | POA: Diagnosis not present

## 2020-01-31 DIAGNOSIS — M5440 Lumbago with sciatica, unspecified side: Secondary | ICD-10-CM | POA: Diagnosis not present

## 2020-02-01 DIAGNOSIS — N400 Enlarged prostate without lower urinary tract symptoms: Secondary | ICD-10-CM | POA: Diagnosis not present

## 2020-02-01 DIAGNOSIS — M5440 Lumbago with sciatica, unspecified side: Secondary | ICD-10-CM | POA: Diagnosis not present

## 2020-02-01 DIAGNOSIS — R600 Localized edema: Secondary | ICD-10-CM | POA: Diagnosis not present

## 2020-02-01 DIAGNOSIS — K760 Fatty (change of) liver, not elsewhere classified: Secondary | ICD-10-CM | POA: Diagnosis not present

## 2020-02-01 DIAGNOSIS — I10 Essential (primary) hypertension: Secondary | ICD-10-CM | POA: Diagnosis not present

## 2020-02-01 DIAGNOSIS — Z6841 Body Mass Index (BMI) 40.0 and over, adult: Secondary | ICD-10-CM | POA: Diagnosis not present

## 2020-02-01 DIAGNOSIS — J019 Acute sinusitis, unspecified: Secondary | ICD-10-CM | POA: Diagnosis not present

## 2020-02-01 DIAGNOSIS — E7801 Familial hypercholesterolemia: Secondary | ICD-10-CM | POA: Diagnosis not present

## 2020-02-01 DIAGNOSIS — E1165 Type 2 diabetes mellitus with hyperglycemia: Secondary | ICD-10-CM | POA: Diagnosis not present

## 2020-02-01 DIAGNOSIS — E668 Other obesity: Secondary | ICD-10-CM | POA: Diagnosis not present

## 2020-02-01 DIAGNOSIS — E049 Nontoxic goiter, unspecified: Secondary | ICD-10-CM | POA: Diagnosis not present

## 2020-02-01 DIAGNOSIS — M2390 Unspecified internal derangement of unspecified knee: Secondary | ICD-10-CM | POA: Diagnosis not present

## 2020-04-18 DIAGNOSIS — E049 Nontoxic goiter, unspecified: Secondary | ICD-10-CM | POA: Diagnosis not present

## 2020-04-18 DIAGNOSIS — E039 Hypothyroidism, unspecified: Secondary | ICD-10-CM | POA: Diagnosis not present

## 2020-04-18 DIAGNOSIS — N400 Enlarged prostate without lower urinary tract symptoms: Secondary | ICD-10-CM | POA: Diagnosis not present

## 2020-04-18 DIAGNOSIS — J019 Acute sinusitis, unspecified: Secondary | ICD-10-CM | POA: Diagnosis not present

## 2020-04-18 DIAGNOSIS — E668 Other obesity: Secondary | ICD-10-CM | POA: Diagnosis not present

## 2020-04-18 DIAGNOSIS — M2390 Unspecified internal derangement of unspecified knee: Secondary | ICD-10-CM | POA: Diagnosis not present

## 2020-04-18 DIAGNOSIS — M5440 Lumbago with sciatica, unspecified side: Secondary | ICD-10-CM | POA: Diagnosis not present

## 2020-04-18 DIAGNOSIS — I10 Essential (primary) hypertension: Secondary | ICD-10-CM | POA: Diagnosis not present

## 2020-04-18 DIAGNOSIS — E7801 Familial hypercholesterolemia: Secondary | ICD-10-CM | POA: Diagnosis not present

## 2020-04-18 DIAGNOSIS — E1165 Type 2 diabetes mellitus with hyperglycemia: Secondary | ICD-10-CM | POA: Diagnosis not present

## 2020-04-18 DIAGNOSIS — Z6841 Body Mass Index (BMI) 40.0 and over, adult: Secondary | ICD-10-CM | POA: Diagnosis not present

## 2020-04-18 DIAGNOSIS — R600 Localized edema: Secondary | ICD-10-CM | POA: Diagnosis not present

## 2020-06-14 DIAGNOSIS — E049 Nontoxic goiter, unspecified: Secondary | ICD-10-CM | POA: Diagnosis not present

## 2020-06-14 DIAGNOSIS — D509 Iron deficiency anemia, unspecified: Secondary | ICD-10-CM | POA: Diagnosis not present

## 2020-06-14 DIAGNOSIS — E7801 Familial hypercholesterolemia: Secondary | ICD-10-CM | POA: Diagnosis not present

## 2020-06-14 DIAGNOSIS — Z125 Encounter for screening for malignant neoplasm of prostate: Secondary | ICD-10-CM | POA: Diagnosis not present

## 2020-06-14 DIAGNOSIS — E1165 Type 2 diabetes mellitus with hyperglycemia: Secondary | ICD-10-CM | POA: Diagnosis not present

## 2020-06-21 DIAGNOSIS — E049 Nontoxic goiter, unspecified: Secondary | ICD-10-CM | POA: Diagnosis not present

## 2020-06-21 DIAGNOSIS — N400 Enlarged prostate without lower urinary tract symptoms: Secondary | ICD-10-CM | POA: Diagnosis not present

## 2020-06-21 DIAGNOSIS — N200 Calculus of kidney: Secondary | ICD-10-CM | POA: Diagnosis not present

## 2020-06-21 DIAGNOSIS — E1165 Type 2 diabetes mellitus with hyperglycemia: Secondary | ICD-10-CM | POA: Diagnosis not present

## 2020-06-21 DIAGNOSIS — E7801 Familial hypercholesterolemia: Secondary | ICD-10-CM | POA: Diagnosis not present

## 2020-06-21 DIAGNOSIS — Z6841 Body Mass Index (BMI) 40.0 and over, adult: Secondary | ICD-10-CM | POA: Diagnosis not present

## 2020-06-21 DIAGNOSIS — K760 Fatty (change of) liver, not elsewhere classified: Secondary | ICD-10-CM | POA: Diagnosis not present

## 2020-06-21 DIAGNOSIS — E668 Other obesity: Secondary | ICD-10-CM | POA: Diagnosis not present

## 2020-06-21 DIAGNOSIS — E039 Hypothyroidism, unspecified: Secondary | ICD-10-CM | POA: Diagnosis not present

## 2020-06-21 DIAGNOSIS — I10 Essential (primary) hypertension: Secondary | ICD-10-CM | POA: Diagnosis not present

## 2020-07-26 DIAGNOSIS — I493 Ventricular premature depolarization: Secondary | ICD-10-CM | POA: Diagnosis not present

## 2020-07-26 DIAGNOSIS — R0602 Shortness of breath: Secondary | ICD-10-CM | POA: Diagnosis not present

## 2020-07-26 DIAGNOSIS — Z1211 Encounter for screening for malignant neoplasm of colon: Secondary | ICD-10-CM | POA: Diagnosis not present

## 2020-07-26 DIAGNOSIS — E7801 Familial hypercholesterolemia: Secondary | ICD-10-CM | POA: Diagnosis not present

## 2020-07-26 DIAGNOSIS — Z125 Encounter for screening for malignant neoplasm of prostate: Secondary | ICD-10-CM | POA: Diagnosis not present

## 2020-07-26 DIAGNOSIS — I10 Essential (primary) hypertension: Secondary | ICD-10-CM | POA: Diagnosis not present

## 2020-07-26 DIAGNOSIS — Z1331 Encounter for screening for depression: Secondary | ICD-10-CM | POA: Diagnosis not present

## 2020-07-26 DIAGNOSIS — R600 Localized edema: Secondary | ICD-10-CM | POA: Diagnosis not present

## 2020-07-26 DIAGNOSIS — Z713 Dietary counseling and surveillance: Secondary | ICD-10-CM | POA: Diagnosis not present

## 2020-07-26 DIAGNOSIS — Z136 Encounter for screening for cardiovascular disorders: Secondary | ICD-10-CM | POA: Diagnosis not present

## 2020-07-26 DIAGNOSIS — E1165 Type 2 diabetes mellitus with hyperglycemia: Secondary | ICD-10-CM | POA: Diagnosis not present

## 2020-07-26 DIAGNOSIS — Z0001 Encounter for general adult medical examination with abnormal findings: Secondary | ICD-10-CM | POA: Diagnosis not present

## 2020-07-27 DIAGNOSIS — M2390 Unspecified internal derangement of unspecified knee: Secondary | ICD-10-CM | POA: Diagnosis not present

## 2020-07-27 DIAGNOSIS — R600 Localized edema: Secondary | ICD-10-CM | POA: Diagnosis not present

## 2020-07-27 DIAGNOSIS — E1165 Type 2 diabetes mellitus with hyperglycemia: Secondary | ICD-10-CM | POA: Diagnosis not present

## 2020-07-27 DIAGNOSIS — K760 Fatty (change of) liver, not elsewhere classified: Secondary | ICD-10-CM | POA: Diagnosis not present

## 2020-07-27 DIAGNOSIS — I493 Ventricular premature depolarization: Secondary | ICD-10-CM | POA: Diagnosis not present

## 2020-07-27 DIAGNOSIS — R0989 Other specified symptoms and signs involving the circulatory and respiratory systems: Secondary | ICD-10-CM | POA: Diagnosis not present

## 2020-07-27 DIAGNOSIS — R0602 Shortness of breath: Secondary | ICD-10-CM | POA: Diagnosis not present

## 2020-07-27 DIAGNOSIS — I10 Essential (primary) hypertension: Secondary | ICD-10-CM | POA: Diagnosis not present

## 2020-07-27 DIAGNOSIS — R9431 Abnormal electrocardiogram [ECG] [EKG]: Secondary | ICD-10-CM | POA: Diagnosis not present

## 2020-07-27 DIAGNOSIS — E049 Nontoxic goiter, unspecified: Secondary | ICD-10-CM | POA: Diagnosis not present

## 2020-07-27 DIAGNOSIS — E039 Hypothyroidism, unspecified: Secondary | ICD-10-CM | POA: Diagnosis not present

## 2020-07-27 DIAGNOSIS — E7801 Familial hypercholesterolemia: Secondary | ICD-10-CM | POA: Diagnosis not present

## 2020-07-27 DIAGNOSIS — E668 Other obesity: Secondary | ICD-10-CM | POA: Diagnosis not present

## 2020-07-28 DIAGNOSIS — E119 Type 2 diabetes mellitus without complications: Secondary | ICD-10-CM | POA: Diagnosis not present

## 2020-08-16 DIAGNOSIS — R9431 Abnormal electrocardiogram [ECG] [EKG]: Secondary | ICD-10-CM | POA: Diagnosis not present

## 2020-08-21 DIAGNOSIS — R9431 Abnormal electrocardiogram [ECG] [EKG]: Secondary | ICD-10-CM | POA: Diagnosis not present

## 2020-09-13 DIAGNOSIS — D509 Iron deficiency anemia, unspecified: Secondary | ICD-10-CM | POA: Diagnosis not present

## 2020-09-13 DIAGNOSIS — E1165 Type 2 diabetes mellitus with hyperglycemia: Secondary | ICD-10-CM | POA: Diagnosis not present

## 2020-09-13 DIAGNOSIS — E790 Hyperuricemia without signs of inflammatory arthritis and tophaceous disease: Secondary | ICD-10-CM | POA: Diagnosis not present

## 2020-09-13 DIAGNOSIS — E049 Nontoxic goiter, unspecified: Secondary | ICD-10-CM | POA: Diagnosis not present

## 2020-09-13 DIAGNOSIS — E668 Other obesity: Secondary | ICD-10-CM | POA: Diagnosis not present

## 2020-09-13 DIAGNOSIS — E7801 Familial hypercholesterolemia: Secondary | ICD-10-CM | POA: Diagnosis not present

## 2020-09-20 DIAGNOSIS — E049 Nontoxic goiter, unspecified: Secondary | ICD-10-CM | POA: Diagnosis not present

## 2020-09-20 DIAGNOSIS — M5440 Lumbago with sciatica, unspecified side: Secondary | ICD-10-CM | POA: Diagnosis not present

## 2020-09-20 DIAGNOSIS — K760 Fatty (change of) liver, not elsewhere classified: Secondary | ICD-10-CM | POA: Diagnosis not present

## 2020-09-20 DIAGNOSIS — R600 Localized edema: Secondary | ICD-10-CM | POA: Diagnosis not present

## 2020-09-20 DIAGNOSIS — I493 Ventricular premature depolarization: Secondary | ICD-10-CM | POA: Diagnosis not present

## 2020-09-20 DIAGNOSIS — N200 Calculus of kidney: Secondary | ICD-10-CM | POA: Diagnosis not present

## 2020-09-20 DIAGNOSIS — M2390 Unspecified internal derangement of unspecified knee: Secondary | ICD-10-CM | POA: Diagnosis not present

## 2020-09-20 DIAGNOSIS — I10 Essential (primary) hypertension: Secondary | ICD-10-CM | POA: Diagnosis not present

## 2020-09-20 DIAGNOSIS — E668 Other obesity: Secondary | ICD-10-CM | POA: Diagnosis not present

## 2020-09-20 DIAGNOSIS — E1165 Type 2 diabetes mellitus with hyperglycemia: Secondary | ICD-10-CM | POA: Diagnosis not present

## 2020-09-20 DIAGNOSIS — E7801 Familial hypercholesterolemia: Secondary | ICD-10-CM | POA: Diagnosis not present

## 2020-09-20 DIAGNOSIS — E039 Hypothyroidism, unspecified: Secondary | ICD-10-CM | POA: Diagnosis not present

## 2020-10-18 DIAGNOSIS — E049 Nontoxic goiter, unspecified: Secondary | ICD-10-CM | POA: Diagnosis not present

## 2020-10-18 DIAGNOSIS — E7801 Familial hypercholesterolemia: Secondary | ICD-10-CM | POA: Diagnosis not present

## 2020-10-18 DIAGNOSIS — M5440 Lumbago with sciatica, unspecified side: Secondary | ICD-10-CM | POA: Diagnosis not present

## 2020-10-18 DIAGNOSIS — I10 Essential (primary) hypertension: Secondary | ICD-10-CM | POA: Diagnosis not present

## 2020-10-18 DIAGNOSIS — E1165 Type 2 diabetes mellitus with hyperglycemia: Secondary | ICD-10-CM | POA: Diagnosis not present

## 2020-10-18 DIAGNOSIS — J202 Acute bronchitis due to streptococcus: Secondary | ICD-10-CM | POA: Diagnosis not present

## 2020-10-18 DIAGNOSIS — M2390 Unspecified internal derangement of unspecified knee: Secondary | ICD-10-CM | POA: Diagnosis not present

## 2020-10-18 DIAGNOSIS — N200 Calculus of kidney: Secondary | ICD-10-CM | POA: Diagnosis not present

## 2020-10-18 DIAGNOSIS — E039 Hypothyroidism, unspecified: Secondary | ICD-10-CM | POA: Diagnosis not present

## 2020-10-18 DIAGNOSIS — J019 Acute sinusitis, unspecified: Secondary | ICD-10-CM | POA: Diagnosis not present

## 2020-10-18 DIAGNOSIS — I493 Ventricular premature depolarization: Secondary | ICD-10-CM | POA: Diagnosis not present

## 2020-11-01 DIAGNOSIS — E063 Autoimmune thyroiditis: Secondary | ICD-10-CM | POA: Diagnosis not present

## 2020-11-01 DIAGNOSIS — E039 Hypothyroidism, unspecified: Secondary | ICD-10-CM | POA: Diagnosis not present

## 2020-11-08 DIAGNOSIS — N200 Calculus of kidney: Secondary | ICD-10-CM | POA: Diagnosis not present

## 2020-11-08 DIAGNOSIS — I10 Essential (primary) hypertension: Secondary | ICD-10-CM | POA: Diagnosis not present

## 2020-11-08 DIAGNOSIS — R600 Localized edema: Secondary | ICD-10-CM | POA: Diagnosis not present

## 2020-11-08 DIAGNOSIS — N400 Enlarged prostate without lower urinary tract symptoms: Secondary | ICD-10-CM | POA: Diagnosis not present

## 2020-11-08 DIAGNOSIS — E668 Other obesity: Secondary | ICD-10-CM | POA: Diagnosis not present

## 2020-11-08 DIAGNOSIS — E049 Nontoxic goiter, unspecified: Secondary | ICD-10-CM | POA: Diagnosis not present

## 2020-11-08 DIAGNOSIS — J019 Acute sinusitis, unspecified: Secondary | ICD-10-CM | POA: Diagnosis not present

## 2020-11-08 DIAGNOSIS — E7801 Familial hypercholesterolemia: Secondary | ICD-10-CM | POA: Diagnosis not present

## 2020-11-08 DIAGNOSIS — Z6841 Body Mass Index (BMI) 40.0 and over, adult: Secondary | ICD-10-CM | POA: Diagnosis not present

## 2020-11-08 DIAGNOSIS — Z7189 Other specified counseling: Secondary | ICD-10-CM | POA: Diagnosis not present

## 2020-11-08 DIAGNOSIS — E039 Hypothyroidism, unspecified: Secondary | ICD-10-CM | POA: Diagnosis not present

## 2020-11-08 DIAGNOSIS — E1165 Type 2 diabetes mellitus with hyperglycemia: Secondary | ICD-10-CM | POA: Diagnosis not present

## 2021-02-07 DIAGNOSIS — U071 COVID-19: Secondary | ICD-10-CM | POA: Diagnosis not present

## 2021-02-07 DIAGNOSIS — E039 Hypothyroidism, unspecified: Secondary | ICD-10-CM | POA: Diagnosis not present

## 2021-02-07 DIAGNOSIS — E668 Other obesity: Secondary | ICD-10-CM | POA: Diagnosis not present

## 2021-02-07 DIAGNOSIS — J019 Acute sinusitis, unspecified: Secondary | ICD-10-CM | POA: Diagnosis not present

## 2021-02-07 DIAGNOSIS — E063 Autoimmune thyroiditis: Secondary | ICD-10-CM | POA: Diagnosis not present

## 2021-02-07 DIAGNOSIS — E790 Hyperuricemia without signs of inflammatory arthritis and tophaceous disease: Secondary | ICD-10-CM | POA: Diagnosis not present

## 2021-02-07 DIAGNOSIS — E7801 Familial hypercholesterolemia: Secondary | ICD-10-CM | POA: Diagnosis not present

## 2021-02-07 DIAGNOSIS — Z1152 Encounter for screening for COVID-19: Secondary | ICD-10-CM | POA: Diagnosis not present

## 2021-02-07 DIAGNOSIS — I10 Essential (primary) hypertension: Secondary | ICD-10-CM | POA: Diagnosis not present

## 2021-02-07 DIAGNOSIS — R600 Localized edema: Secondary | ICD-10-CM | POA: Diagnosis not present

## 2021-02-07 DIAGNOSIS — J029 Acute pharyngitis, unspecified: Secondary | ICD-10-CM | POA: Diagnosis not present

## 2021-02-07 DIAGNOSIS — E1165 Type 2 diabetes mellitus with hyperglycemia: Secondary | ICD-10-CM | POA: Diagnosis not present

## 2021-02-27 DIAGNOSIS — R600 Localized edema: Secondary | ICD-10-CM | POA: Diagnosis not present

## 2021-02-27 DIAGNOSIS — J019 Acute sinusitis, unspecified: Secondary | ICD-10-CM | POA: Diagnosis not present

## 2021-02-27 DIAGNOSIS — E1165 Type 2 diabetes mellitus with hyperglycemia: Secondary | ICD-10-CM | POA: Diagnosis not present

## 2021-02-27 DIAGNOSIS — E7801 Familial hypercholesterolemia: Secondary | ICD-10-CM | POA: Diagnosis not present

## 2021-02-27 DIAGNOSIS — Z6841 Body Mass Index (BMI) 40.0 and over, adult: Secondary | ICD-10-CM | POA: Diagnosis not present

## 2021-02-27 DIAGNOSIS — E668 Other obesity: Secondary | ICD-10-CM | POA: Diagnosis not present

## 2021-02-27 DIAGNOSIS — E049 Nontoxic goiter, unspecified: Secondary | ICD-10-CM | POA: Diagnosis not present

## 2021-02-27 DIAGNOSIS — E039 Hypothyroidism, unspecified: Secondary | ICD-10-CM | POA: Diagnosis not present

## 2021-02-27 DIAGNOSIS — I10 Essential (primary) hypertension: Secondary | ICD-10-CM | POA: Diagnosis not present

## 2021-02-27 DIAGNOSIS — K760 Fatty (change of) liver, not elsewhere classified: Secondary | ICD-10-CM | POA: Diagnosis not present

## 2021-05-30 DIAGNOSIS — R600 Localized edema: Secondary | ICD-10-CM | POA: Diagnosis not present

## 2021-05-30 DIAGNOSIS — K760 Fatty (change of) liver, not elsewhere classified: Secondary | ICD-10-CM | POA: Diagnosis not present

## 2021-05-30 DIAGNOSIS — N400 Enlarged prostate without lower urinary tract symptoms: Secondary | ICD-10-CM | POA: Diagnosis not present

## 2021-05-30 DIAGNOSIS — E1165 Type 2 diabetes mellitus with hyperglycemia: Secondary | ICD-10-CM | POA: Diagnosis not present

## 2021-05-30 DIAGNOSIS — D509 Iron deficiency anemia, unspecified: Secondary | ICD-10-CM | POA: Diagnosis not present

## 2021-05-30 DIAGNOSIS — M170 Bilateral primary osteoarthritis of knee: Secondary | ICD-10-CM | POA: Diagnosis not present

## 2021-05-30 DIAGNOSIS — E7801 Familial hypercholesterolemia: Secondary | ICD-10-CM | POA: Diagnosis not present

## 2021-05-30 DIAGNOSIS — E063 Autoimmune thyroiditis: Secondary | ICD-10-CM | POA: Diagnosis not present

## 2021-06-05 DIAGNOSIS — E1165 Type 2 diabetes mellitus with hyperglycemia: Secondary | ICD-10-CM | POA: Diagnosis not present

## 2021-06-05 DIAGNOSIS — M5440 Lumbago with sciatica, unspecified side: Secondary | ICD-10-CM | POA: Diagnosis not present

## 2021-06-05 DIAGNOSIS — E668 Other obesity: Secondary | ICD-10-CM | POA: Diagnosis not present

## 2021-06-05 DIAGNOSIS — N3091 Cystitis, unspecified with hematuria: Secondary | ICD-10-CM | POA: Diagnosis not present

## 2021-06-05 DIAGNOSIS — E063 Autoimmune thyroiditis: Secondary | ICD-10-CM | POA: Diagnosis not present

## 2021-06-05 DIAGNOSIS — I10 Essential (primary) hypertension: Secondary | ICD-10-CM | POA: Diagnosis not present

## 2021-06-05 DIAGNOSIS — N400 Enlarged prostate without lower urinary tract symptoms: Secondary | ICD-10-CM | POA: Diagnosis not present

## 2021-06-05 DIAGNOSIS — I493 Ventricular premature depolarization: Secondary | ICD-10-CM | POA: Diagnosis not present

## 2021-06-05 DIAGNOSIS — E039 Hypothyroidism, unspecified: Secondary | ICD-10-CM | POA: Diagnosis not present

## 2021-06-05 DIAGNOSIS — Z6841 Body Mass Index (BMI) 40.0 and over, adult: Secondary | ICD-10-CM | POA: Diagnosis not present

## 2021-06-05 DIAGNOSIS — E7801 Familial hypercholesterolemia: Secondary | ICD-10-CM | POA: Diagnosis not present

## 2021-06-05 DIAGNOSIS — M2390 Unspecified internal derangement of unspecified knee: Secondary | ICD-10-CM | POA: Diagnosis not present

## 2021-07-31 DIAGNOSIS — I10 Essential (primary) hypertension: Secondary | ICD-10-CM | POA: Diagnosis not present

## 2021-07-31 DIAGNOSIS — Z125 Encounter for screening for malignant neoplasm of prostate: Secondary | ICD-10-CM | POA: Diagnosis not present

## 2021-07-31 DIAGNOSIS — I493 Ventricular premature depolarization: Secondary | ICD-10-CM | POA: Diagnosis not present

## 2021-07-31 DIAGNOSIS — R600 Localized edema: Secondary | ICD-10-CM | POA: Diagnosis not present

## 2021-07-31 DIAGNOSIS — E1165 Type 2 diabetes mellitus with hyperglycemia: Secondary | ICD-10-CM | POA: Diagnosis not present

## 2021-07-31 DIAGNOSIS — Z136 Encounter for screening for cardiovascular disorders: Secondary | ICD-10-CM | POA: Diagnosis not present

## 2021-07-31 DIAGNOSIS — Z1211 Encounter for screening for malignant neoplasm of colon: Secondary | ICD-10-CM | POA: Diagnosis not present

## 2021-07-31 DIAGNOSIS — Z713 Dietary counseling and surveillance: Secondary | ICD-10-CM | POA: Diagnosis not present

## 2021-07-31 DIAGNOSIS — Z0001 Encounter for general adult medical examination with abnormal findings: Secondary | ICD-10-CM | POA: Diagnosis not present

## 2021-07-31 DIAGNOSIS — Z1331 Encounter for screening for depression: Secondary | ICD-10-CM | POA: Diagnosis not present

## 2021-07-31 DIAGNOSIS — Z6841 Body Mass Index (BMI) 40.0 and over, adult: Secondary | ICD-10-CM | POA: Diagnosis not present

## 2021-07-31 DIAGNOSIS — E7801 Familial hypercholesterolemia: Secondary | ICD-10-CM | POA: Diagnosis not present

## 2021-08-07 DIAGNOSIS — E668 Other obesity: Secondary | ICD-10-CM | POA: Diagnosis not present

## 2021-08-07 DIAGNOSIS — K760 Fatty (change of) liver, not elsewhere classified: Secondary | ICD-10-CM | POA: Diagnosis not present

## 2021-08-07 DIAGNOSIS — M5440 Lumbago with sciatica, unspecified side: Secondary | ICD-10-CM | POA: Diagnosis not present

## 2021-08-07 DIAGNOSIS — E7801 Familial hypercholesterolemia: Secondary | ICD-10-CM | POA: Diagnosis not present

## 2021-08-07 DIAGNOSIS — I493 Ventricular premature depolarization: Secondary | ICD-10-CM | POA: Diagnosis not present

## 2021-08-07 DIAGNOSIS — E1165 Type 2 diabetes mellitus with hyperglycemia: Secondary | ICD-10-CM | POA: Diagnosis not present

## 2021-08-07 DIAGNOSIS — I10 Essential (primary) hypertension: Secondary | ICD-10-CM | POA: Diagnosis not present

## 2021-08-07 DIAGNOSIS — N200 Calculus of kidney: Secondary | ICD-10-CM | POA: Diagnosis not present

## 2021-08-07 DIAGNOSIS — R600 Localized edema: Secondary | ICD-10-CM | POA: Diagnosis not present

## 2021-08-13 DIAGNOSIS — E1165 Type 2 diabetes mellitus with hyperglycemia: Secondary | ICD-10-CM | POA: Diagnosis not present

## 2021-08-13 DIAGNOSIS — R600 Localized edema: Secondary | ICD-10-CM | POA: Diagnosis not present

## 2021-08-13 DIAGNOSIS — K760 Fatty (change of) liver, not elsewhere classified: Secondary | ICD-10-CM | POA: Diagnosis not present

## 2021-08-13 DIAGNOSIS — T23222A Burn of second degree of single left finger (nail) except thumb, initial encounter: Secondary | ICD-10-CM | POA: Diagnosis not present

## 2021-08-13 DIAGNOSIS — E668 Other obesity: Secondary | ICD-10-CM | POA: Diagnosis not present

## 2021-08-13 DIAGNOSIS — E039 Hypothyroidism, unspecified: Secondary | ICD-10-CM | POA: Diagnosis not present

## 2021-08-13 DIAGNOSIS — I10 Essential (primary) hypertension: Secondary | ICD-10-CM | POA: Diagnosis not present

## 2021-08-13 DIAGNOSIS — E7801 Familial hypercholesterolemia: Secondary | ICD-10-CM | POA: Diagnosis not present

## 2021-08-13 DIAGNOSIS — L02512 Cutaneous abscess of left hand: Secondary | ICD-10-CM | POA: Diagnosis not present

## 2021-08-28 DIAGNOSIS — R079 Chest pain, unspecified: Secondary | ICD-10-CM | POA: Diagnosis not present

## 2021-08-28 DIAGNOSIS — R0602 Shortness of breath: Secondary | ICD-10-CM | POA: Diagnosis not present

## 2021-08-28 DIAGNOSIS — R0989 Other specified symptoms and signs involving the circulatory and respiratory systems: Secondary | ICD-10-CM | POA: Diagnosis not present

## 2021-08-28 DIAGNOSIS — I493 Ventricular premature depolarization: Secondary | ICD-10-CM | POA: Diagnosis not present

## 2021-09-11 DIAGNOSIS — K760 Fatty (change of) liver, not elsewhere classified: Secondary | ICD-10-CM | POA: Diagnosis not present

## 2021-09-11 DIAGNOSIS — I10 Essential (primary) hypertension: Secondary | ICD-10-CM | POA: Diagnosis not present

## 2021-09-11 DIAGNOSIS — E668 Other obesity: Secondary | ICD-10-CM | POA: Diagnosis not present

## 2021-09-11 DIAGNOSIS — N200 Calculus of kidney: Secondary | ICD-10-CM | POA: Diagnosis not present

## 2021-09-11 DIAGNOSIS — E1165 Type 2 diabetes mellitus with hyperglycemia: Secondary | ICD-10-CM | POA: Diagnosis not present

## 2021-09-11 DIAGNOSIS — Z6841 Body Mass Index (BMI) 40.0 and over, adult: Secondary | ICD-10-CM | POA: Diagnosis not present

## 2021-09-11 DIAGNOSIS — E7801 Familial hypercholesterolemia: Secondary | ICD-10-CM | POA: Diagnosis not present

## 2021-09-11 DIAGNOSIS — I493 Ventricular premature depolarization: Secondary | ICD-10-CM | POA: Diagnosis not present

## 2021-09-11 DIAGNOSIS — E039 Hypothyroidism, unspecified: Secondary | ICD-10-CM | POA: Diagnosis not present

## 2021-09-11 DIAGNOSIS — R600 Localized edema: Secondary | ICD-10-CM | POA: Diagnosis not present

## 2024-10-19 ENCOUNTER — Ambulatory Visit
Admission: RE | Admit: 2024-10-19 | Discharge: 2024-10-19 | Disposition: A | Source: Ambulatory Visit | Attending: Family Medicine | Admitting: Family Medicine

## 2024-10-19 ENCOUNTER — Other Ambulatory Visit: Payer: Self-pay | Admitting: Family Medicine

## 2024-10-19 DIAGNOSIS — M7989 Other specified soft tissue disorders: Secondary | ICD-10-CM | POA: Insufficient documentation

## 2024-10-19 DIAGNOSIS — I872 Venous insufficiency (chronic) (peripheral): Secondary | ICD-10-CM | POA: Insufficient documentation

## 2024-11-05 ENCOUNTER — Ambulatory Visit
Admission: RE | Admit: 2024-11-05 | Discharge: 2024-11-05 | Disposition: A | Discharge status: Home / Self Care | Source: Ambulatory Visit | Attending: Nurse Practitioner | Admitting: Nurse Practitioner

## 2024-11-05 ENCOUNTER — Encounter (INDEPENDENT_AMBULATORY_CARE_PROVIDER_SITE_OTHER): Payer: Self-pay | Admitting: Nurse Practitioner

## 2024-11-05 ENCOUNTER — Encounter (INDEPENDENT_AMBULATORY_CARE_PROVIDER_SITE_OTHER): Payer: PRIVATE HEALTH INSURANCE | Admitting: Nurse Practitioner

## 2024-11-05 VITALS — BP 101/68 | HR 66 | Resp 18 | Ht 69.0 in | Wt 280.0 lb

## 2024-11-05 DIAGNOSIS — N5089 Other specified disorders of the male genital organs: Secondary | ICD-10-CM | POA: Insufficient documentation

## 2024-11-05 DIAGNOSIS — I890 Lymphedema, not elsewhere classified: Secondary | ICD-10-CM

## 2024-11-05 MED ORDER — IOHEXOL 350 MG/ML SOLN
100.0000 mL | Freq: Once | INTRAVENOUS | Status: AC | PRN
  Administered 2024-11-05: 100 mL via INTRAVENOUS

## 2024-11-05 NOTE — Progress Notes (Signed)
 "  Subjective:    Patient ID: Randy Hart, male    DOB: 1956-11-06, 68 y.o.   MRN: [id number] Chief Complaint  Patient presents with   New Patient (Initial Visit)    Ref [facility] consult right leg swelling/venous insufficiency    HPI  Discussed the use of AI scribe software for clinical note transcription with the patient, who gave verbal consent to proceed.  History of Present Illness Randy Hart is a 68 year old male with chronic right lower extremity lymphedema and recent cellulitis who presents with acute right testicular swelling.  He developed acute right testicular swelling beginning last Saturday, which worsened over the following two days and has since remained stable. The right testicle is enlarged to approximately two to three times baseline and is tender but not acutely painful. There is no involvement of the left testicle. He describes discomfort, particularly when sitting, and finds supportive garments such as a jock strap intolerable due to excessive pressure; he currently uses briefs for support. He denies fever, chills, or other systemic symptoms. He is concerned about the rapid onset and persistence of the swelling.  He has a history of right lower extremity swelling, which worsened after Thanksgiving and was diagnosed as cellulitis. He was treated with antibiotics, resulting in gradual improvement. The leg swelling has markedly decreased, with only mild residual swelling. He previously used compression stockings but discontinued them after improvement. He has a remote history of knee surgery and childhood injury to the affected leg. There is no new or worsening leg swelling at this time.  He notes that his leg is currently at its baseline level.  Even at baseline there remains some mild edema.  A lower extremity venous ultrasound performed during the episode of leg swelling was negative for deep vein thrombosis; this study was completed prior to the onset of  testicular swelling. He recently had a severe urinary tract infection treated with antibiotics, though he is unsure if these were intended for the testicular swelling. He has not received antibiotics specifically for the testicular swelling. He takes furosemide  and bumetanide, with no improvement in the testicular swelling. He denies testicular trauma, prior similar episodes, or symptoms suggestive of testicular torsion such as severe pain.    Results Diagnostic 10/19/2024 showed no evidence of DVT in right lower extremity.  There was evidence of subcutaneous edema noted   Review of Systems  Constitutional:  Negative for chills and fever.  Cardiovascular:  Positive for leg swelling.  Genitourinary:  Positive for scrotal swelling.  All other systems reviewed and are negative.      Objective:   Physical Exam Vitals reviewed.  HENT:     Head: Normocephalic.  Cardiovascular:     Rate and Rhythm: Normal rate.  Pulmonary:     Effort: Pulmonary effort is normal.  Genitourinary:    Testes:        Right: Swelling present.  Skin:    General: Skin is warm and dry.  Neurological:     Mental Status: He is alert and oriented to person, place, and time.  Psychiatric:        Mood and Affect: Mood normal.        Behavior: Behavior normal.        Thought Content: Thought content normal.     Physical Exam EXTREMITIES: Mild right lower extremity edema, stasis dermatitis  BP 101/68 (BP Location: Left Arm)   Pulse 66   Resp 18   Ht 5' 9 (1.753  m)   Wt 280 lb (127 kg)   BMI 41.35 kg/m   Past Medical History:  Diagnosis Date   Arthritis    knees   Diabetes mellitus without complication (HCC) 2010   Hypothyroidism    Thyroid  gland disease 2000   Unspecified venous (peripheral) insufficiency 2013    Social History   Socioeconomic History   Marital status: Married    Spouse name: Not on file   Number of children: Not on file   Years of education: Not on file   Highest  education level: Not on file  Occupational History   Not on file  Tobacco Use   Smoking status: Former    Current packs/day: 0.00    Average packs/day: 1 pack/day for 15.0 years (15.0 ttl pk-yrs)    Types: Cigarettes    Start date: 07/18/1971    Quit date: 07/17/1986    Years since quitting: 38.3   Smokeless tobacco: Never  Substance and Sexual Activity   Alcohol use: No   Drug use: No   Sexual activity: Not on file  Other Topics Concern   Not on file  Social History Narrative   Not on file   Social Drivers of Health   Tobacco Use: Medium Risk (11/05/2024)   Patient History    Smoking Tobacco Use: Former    Smokeless Tobacco Use: Never    Passive Exposure: Not on file  Financial Resource Strain: Patient Declined (04/07/2024)   Received from [facility]   Overall Financial Resource Strain (CARDIA)    Difficulty of Paying Living Expenses: Patient declined  Food Insecurity: Patient Declined (04/07/2024)   Received from [facility]   Epic    Within the past 12 months, you worried that your food would run out before you got the money to buy more.: Patient declined    Within the past 12 months, the food you bought just didn't last and you didn't have money to get more.: Patient declined  Transportation Needs: Patient Declined (04/07/2024)   Received from [facility] - Transportation    In the past 12 months, has lack of transportation kept you from medical appointments or from getting medications?: Patient declined    Lack of Transportation (Non-Medical): Patient declined  Physical Activity: Not on file  Stress: Not on file  Social Connections: Not on file  Intimate Partner Violence: Not on file  Depression (EYV7-0): Not on file  Alcohol Screen: Not on file  Housing: Patient Declined (04/07/2024)   Received from [facility]   Epic    In the last 12 months, was there a time when you were not able  to pay the mortgage or rent on time?: Patient declined    Number of Times Moved in the Last Year: Not on file    At any time in the past 12 months, were you homeless or living in a shelter (including now)?: Patient declined  Utilities: Patient Declined (04/07/2024)   Received from [facility] Utilities    Threatened with loss of utilities: Patient declined  Health Literacy: Not on file    Past Surgical History:  Procedure Laterality Date   COLONOSCOPY  1993   [facility]    GREEN LIGHT LASER TURP (TRANSURETHRAL RESECTION OF PROSTATE N/A 07/23/2016   Procedure: GREEN LIGHT LASER TURP (TRANSURETHRAL RESECTION OF PROSTATE;  Surgeon: Ozell JONELLE Burkes, MD;  Location: [facility] ORS;  Service:  Urology;  Laterality: N/A;   KNEE SURGERY Bilateral 1970,2001    History reviewed. No pertinent family history.  Allergies[1]      No data to display            CMP     Component Value Date/Time   NA 135 07/19/2016 1150   K 4.0 07/19/2016 1150   CL 103 07/19/2016 1150   CO2 28 07/19/2016 1150   GLUCOSE 220 (H) 07/19/2016 1150   BUN 18 07/19/2016 1150   CREATININE 0.69 07/19/2016 1150   CALCIUM 8.6 (L) 07/19/2016 1150   GFRNONAA >60 07/19/2016 1150     No results found.     Assessment & Plan:   1. Scrotal edema (Primary) Right testicular swelling Acute right testicular swelling with rapid onset, tender but not acutely painful. Differential includes vascular and urological causes, with urological etiology more likely due to recent UTI. Vascular causes less likely due to absence of acute leg swelling and negative DVT ultrasound.  In the instance that it was a pelvic thrombus, he should have significant lower extremity edema.  Although not impossible there is also a low chance this may be related to possible pelvic congestion. - Ordered stat CT venogram of abdomen and pelvis to assess for vascular causes. - Placed referral to urology for further  evaluation. - Advised emergency care if swelling increases rapidly or becomes acutely painful. - Discussed CT cancellation if urology determines non-vascular etiology. - CT VENOGRAM ABD/PEL; Future   2. Lymphedema Chronic right lower extremity lymphedema Chronic lymphedema with improvement post weight loss, but persistent swelling. 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  - Recommended resumption of compression stockings on both legs for symmetry and edema control. - Advised leg elevation when at home and inactive. - Provided education on compression therapy for cellulitis prevention and lymphedema management.   Assessment and Plan Assessment & Plan        Medications Ordered Prior to Encounter[2]  There are no Patient Instructions on file for this visit. No follow-ups on file.   Cari Vandeberg E Rheanna Sergent, NP       [1] No Known Allergies [2]  Current Outpatient Medications on File Prior to Visit  Medication Sig Dispense Refill   BD PEN NEEDLE ORIG ULTRAFINE 29G X 12.7MM MISC Inject 3 each into the skin.     bumetanide (BUMEX) 1 MG tablet Take 1 mg by mouth 2 (two) times daily.     furosemide  (LASIX ) 40 MG tablet Take 40 mg by mouth daily. (Patient taking differently: Take 80 mg by mouth daily.)     insulin aspart protamine - aspart (NOVOLOG 70/30 MIX) (70-30) 100 UNIT/ML FlexPen Inject 25 Units into the skin 2 (two) times daily with a meal.     lisinopril (PRINIVIL,ZESTRIL) 10 MG tablet Take 10 mg by mouth 2 (two) times daily.      Semaglutide, 1 MG/DOSE, (OZEMPIC, 1 MG/DOSE,) 4 MG/3ML SOPN Inject 1 mg into the skin once a week.     simvastatin (ZOCOR) 20 MG tablet Take 20 mg by mouth at bedtime.     SYNTHROID 150 MCG tablet Take 150 mcg by mouth daily before breakfast.     tamsulosin (FLOMAX) 0.4 MG CAPS capsule Take 0.8 mg by mouth daily.     acetaminophen -codeine  (TYLENOL  #3) 300-30 MG  tablet Take 1-2 tablets by mouth every 4 (four) hours as needed for moderate pain. (Patient not taking:  Reported on 11/05/2024) 30 tablet 2   docusate sodium  (COLACE) 100 MG capsule Take 2 capsules (200 mg total) by mouth 2 (two) times daily. (Patient not taking: Reported on 11/05/2024) 120 capsule 3   insulin lispro (HUMALOG) 100 UNIT/ML injection Inject 40 Units into the skin 2 (two) times daily. (Patient not taking: Reported on 11/05/2024)     levofloxacin  (LEVAQUIN ) 500 MG tablet Take 1 tablet (500 mg total) by mouth daily. (Patient not taking: Reported on 11/05/2024) 3 tablet 0   Meth-Hyo-M Bl-Na Phos-Ph Sal (URIBEL ) 118 MG CAPS Take 1 capsule (118 mg total) by mouth every 6 (six) hours as needed (dysuria). (Patient not taking: Reported on 11/05/2024) 40 capsule 3   Multiple Vitamins-Minerals (MULTIVITAMIN WITH MINERALS) tablet Take 1 tablet by mouth daily. (Patient not taking: Reported on 11/05/2024)     thyroid  (ARMOUR) 180 MG tablet Take 180 mg by mouth every morning.  (Patient not taking: Reported on 11/05/2024)     No current facility-administered medications on file prior to visit.   "

## 2024-11-16 ENCOUNTER — Encounter (INDEPENDENT_AMBULATORY_CARE_PROVIDER_SITE_OTHER): Payer: Self-pay

## 2024-11-24 ENCOUNTER — Ambulatory Visit: Admitting: Urology

## 2024-11-24 VITALS — BP 100/63 | HR 89 | Ht 70.0 in | Wt 280.0 lb

## 2024-11-24 DIAGNOSIS — Z125 Encounter for screening for malignant neoplasm of prostate: Secondary | ICD-10-CM

## 2024-11-24 DIAGNOSIS — N390 Urinary tract infection, site not specified: Secondary | ICD-10-CM | POA: Diagnosis not present

## 2024-11-24 DIAGNOSIS — R399 Unspecified symptoms and signs involving the genitourinary system: Secondary | ICD-10-CM

## 2024-11-24 NOTE — Progress Notes (Signed)
" ° °  11/24/2024 5:00 PM   Randy Hart 1956/09/05 [id number]  CC: Scrotal swelling/pain, UTI, BPH  HPI: 69 year old male here with his wife today who helps provide some of the history.  He has a history of a greenlight laser PVP by Dr. Gala in 2017.  He was recently seen in vascular surgery in mid December with right sided scrotal swelling and scrotal pain, CT showed hydrocele, he was also treated with antibiotics based on concerning urinalysis through PCP, urine culture ultimately grew providencia.  He was treated with Levaquin .  Scrotal pain has resolved at this point and swelling has continued to decrease.  He denies any significant urinary symptoms today.  He was told in the past by Dr. Gala that he had a large bladder diverticulum   PMH: Past Medical History:  Diagnosis Date   Arthritis    knees   Diabetes mellitus without complication (HCC) 2010   Hypothyroidism    Thyroid  gland disease 2000   Unspecified venous (peripheral) insufficiency 2013    Surgical History: Past Surgical History:  Procedure Laterality Date   COLONOSCOPY  1993   [facility]    GREEN LIGHT LASER TURP (TRANSURETHRAL RESECTION OF PROSTATE N/A 07/23/2016   Procedure: GREEN LIGHT LASER TURP (TRANSURETHRAL RESECTION OF PROSTATE;  Surgeon: Ozell JONELLE Burkes, MD;  Location: [facility] ORS;  Service: Urology;  Laterality: N/A;   KNEE SURGERY Bilateral 1970,2001     Family History: No family history on file.  Social History:  reports that he quit smoking about 38 years ago. His smoking use included cigarettes. He started smoking about 53 years ago. He has a 15 pack-year smoking history. He has never used smokeless tobacco. He reports that he does not drink alcohol and does not use drugs.  Physical Exam: BP 100/63   Pulse 89   Ht 5' 10 (1.778 m)   Wt 280 lb (127 kg)   SpO2 91%   BMI 40.18 kg/m    Constitutional:  Alert and oriented, No acute distress. Cardiovascular: No clubbing, cyanosis,  or edema. Respiratory: Normal respiratory effort, no increased work of breathing. GI: Abdomen is soft, nontender, nondistended, no abdominal masses GU: Mild right scrotal swelling, nontender  Laboratory Data: Reviewed, normal renal function PSA normal at 0.4  Pertinent Imaging: I have personally viewed and interpreted the CT scan showing bilateral hydroceles, large bladder with diverticulum, no hydronephrosis.  Assessment & Plan:   69 year old male with likely recent epididymitis/orchitis that has since resolved after antibiotics.  He has a large bladder with bladder diverticulum that is likely chronic, no hydronephrosis or altered renal function.  Has been on Flomax long-term.  Could consider cystoscopy and prostate volume assessment in the future if recurrent infections  Recommended cranberry tablet prophylaxis for history of UTIs Continue Flomax RTC 6 months PVR and symptom check, expect PVR likely chronically elevated Consider cystoscopy if recurrent infections   Redell Burnet, MD 11/24/2024  [facility] [address], [address] [place], [place] [address] [phone]   "

## 2024-11-24 NOTE — Patient Instructions (Signed)
 You likely had a right testicular infection called orchitis/epididymitis.  Take cranberry tablets daily to help prevent recurrent infections.  Orchitis  Orchitis is inflammation of a testicle. Testicles are the male organs that produce sperm. The testicles are held in a fleshy sac (scrotum) located behind the penis. Orchitis usually affects only one testicle, but it can affect both. Orchitis is caused by infection. Many kinds of bacteria and viruses can cause this infection. The condition can develop suddenly. What are the causes? This condition may be caused by: Infection from viruses or bacteria. Other organisms, such as fungi or parasites. This is rare but can happen in men who have a weak body defense system (immune system), such as men who have HIV. Bacteria  Bacterial orchitis often occurs along with an infection of the tube that collects and stores sperm (epididymis). In men who are not sexually active, this infection usually starts as a urinary tract infection and spreads to the testicle. In sexually active men, sexually transmitted infections (STIs) are the most common cause of bacterial orchitis. These can include: Gonorrhea. Chlamydia. Viruses Mumps is the most common cause of viral orchitis, though mumps is now rare in many areas because of vaccination. Other viruses that can cause orchitis include: The chickenpox virus (varicella-zoster virus). The virus that causes mononucleosis (Epstein-Barr virus). What increases the risk? The following factors may make you more likely to develop this condition: For viral orchitis: Not having been vaccinated against mumps. For bacterial orchitis: Having had frequent urinary tract infections. Engaging in high-risk sexual behaviors, such as having multiple sexual partners or having sex without using a condom. Having a sexual partner with an STI. Having had urinary tract surgery. Using a tube that is passed through the penis to drain urine  (Foley catheter). Having an enlarged prostate gland. What are the signs or symptoms? The most common symptoms of orchitis are swelling and pain in the scrotum. Other signs and symptoms may include: Feeling generally sick (malaise). Fever and chills. Painful urination. Painful ejaculation. Headache. Fatigue. Nausea. Blood or discharge from the penis. Swollen lymph nodes in the groin area (inguinal nodes). How is this diagnosed? This condition may be diagnosed based on: Your symptoms. Your health care provider may suspect orchitis if you have a painful, swollen testicle along with other signs and symptoms of the condition. A physical exam. You may also have other tests, including: A blood test to check for signs of infection. A urine test to check for a urinary tract infection or STI. Using a swab to collect a fluid sample from the tip of the penis to test for STIs. Taking an image of the testicle using sound waves and a computer (testicular ultrasound). How is this treated? Treatment for this condition depends on the cause.  For bacterial orchitis, your health care provider may prescribe antibiotic medicines. Bacterial infections usually clear up within a few days. For both viral infections and bacterial infections, treatment may include: Rest. Anti-inflammatory medicines. Pain medicines. Raising (elevating) the scrotum with a towel or pillow and applying ice. Follow these instructions at home: Managing pain and swelling Elevate your scrotum and apply ice as directed. To do this: Put ice in a plastic bag. Place a small towel or pillow between your legs. Rest your scrotum on the pillow or towel. Place another towel between your skin and the plastic bag. Leave the ice on for 20 minutes, 2-3 times a day. Remove the ice if your skin turns bright red. This is very important.  If you cannot feel pain, heat, or cold, you have a greater risk of damage to the area. General  instructions Rest as told by your health care provider. Take over-the-counter and prescription medicines only as told by your health care provider. If you were prescribed an antibiotic medicine, take it as told by your health care provider. Do not stop taking the antibiotic even if you start to feel better. Do not have sex until your health care provider says it is okay to do so. Keep all follow-up visits. This is important. Contact a health care provider if: You have a fever. Pain and swelling have not gotten better after 3 days. Get help right away if: Your pain is getting worse. The swelling in your testicle gets worse. Summary Orchitis is inflammation of a testicle. It is caused by an infection from bacteria or a virus. The most common symptoms of orchitis are swelling and pain in the scrotum. Treatment for this condition depends on the cause. It may include medicines to fight the infection, reduce inflammation, and relieve the pain. Follow your health care provider's instructions about resting, icing, not having sex, and taking medicines. This information is not intended to replace advice given to you by your health care provider. Make sure you discuss any questions you have with your health care provider. Document Revised: 05/15/2021 Document Reviewed: 05/15/2021 Elsevier Patient Education  2025 [employer].

## 2025-05-25 ENCOUNTER — Ambulatory Visit: Admitting: Urology
